# Patient Record
Sex: Female | Born: 1980 | Race: White | Hispanic: No | Marital: Married | Smoking: Never smoker
Health system: Southern US, Community
[De-identification: ages and names within clinical notes are randomized; demographics above are authoritative.]

## PROBLEM LIST (undated history)

## (undated) DIAGNOSIS — Z8619 Personal history of other infectious and parasitic diseases: Secondary | ICD-10-CM

## (undated) DIAGNOSIS — F419 Anxiety disorder, unspecified: Secondary | ICD-10-CM

## (undated) HISTORY — PX: CERVICAL BIOPSY  W/ LOOP ELECTRODE EXCISION: SUR135

## (undated) HISTORY — DX: Anxiety disorder, unspecified: F41.9

## (undated) HISTORY — PX: COLPOSCOPY: SHX161

## (undated) HISTORY — DX: Personal history of other infectious and parasitic diseases: Z86.19

---
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
## 2002-04-06 DIAGNOSIS — K219 Gastro-esophageal reflux disease without esophagitis: Secondary | ICD-10-CM | POA: Insufficient documentation

## 2004-02-16 ENCOUNTER — Emergency Department: Payer: Self-pay | Admitting: Emergency Medicine

## 2004-02-22 ENCOUNTER — Ambulatory Visit: Payer: Self-pay

## 2005-04-06 HISTORY — PX: TUBAL LIGATION: SHX77

## 2006-03-10 ENCOUNTER — Observation Stay: Payer: Self-pay

## 2006-04-01 ENCOUNTER — Inpatient Hospital Stay: Payer: Self-pay | Admitting: Obstetrics and Gynecology

## 2006-04-06 DIAGNOSIS — Z90711 Acquired absence of uterus with remaining cervical stump: Secondary | ICD-10-CM | POA: Insufficient documentation

## 2006-04-06 HISTORY — PX: ABDOMINAL HYSTERECTOMY: SHX81

## 2007-01-19 ENCOUNTER — Ambulatory Visit: Payer: Self-pay

## 2007-01-25 ENCOUNTER — Ambulatory Visit: Payer: Self-pay

## 2007-02-16 ENCOUNTER — Ambulatory Visit: Payer: Self-pay

## 2007-02-22 ENCOUNTER — Ambulatory Visit: Payer: Self-pay

## 2008-07-18 DIAGNOSIS — Z8249 Family history of ischemic heart disease and other diseases of the circulatory system: Secondary | ICD-10-CM | POA: Insufficient documentation

## 2011-12-01 LAB — CBC AND DIFFERENTIAL
HCT: 43 % (ref 36–46)
HEMOGLOBIN: 14.7 g/dL (ref 12.0–16.0)
Platelets: 272 10*3/uL (ref 150–399)
WBC: 4.9 10^3/mL

## 2011-12-01 LAB — BASIC METABOLIC PANEL
BUN: 8 mg/dL (ref 4–21)
Creatinine: 0.8 mg/dL (ref 0.5–1.1)
GLUCOSE: 83 mg/dL
POTASSIUM: 4.7 mmol/L (ref 3.4–5.3)
SODIUM: 140 mmol/L (ref 137–147)

## 2011-12-01 LAB — HEPATIC FUNCTION PANEL
ALT: 9 U/L (ref 7–35)
AST: 20 U/L (ref 13–35)

## 2011-12-01 LAB — TSH: TSH: 2.53 u[IU]/mL (ref 0.41–5.90)

## 2011-12-04 ENCOUNTER — Ambulatory Visit: Payer: Self-pay | Admitting: Family Medicine

## 2014-04-10 ENCOUNTER — Ambulatory Visit: Payer: Self-pay | Admitting: General Practice

## 2014-11-15 ENCOUNTER — Other Ambulatory Visit: Payer: Self-pay | Admitting: Unknown Physician Specialty

## 2014-11-15 DIAGNOSIS — Z1231 Encounter for screening mammogram for malignant neoplasm of breast: Secondary | ICD-10-CM

## 2014-11-16 ENCOUNTER — Ambulatory Visit: Payer: Self-pay

## 2015-01-10 LAB — HM PAP SMEAR: HM Pap smear: NEGATIVE

## 2015-04-09 ENCOUNTER — Other Ambulatory Visit: Payer: Self-pay | Admitting: Unknown Physician Specialty

## 2015-04-09 ENCOUNTER — Ambulatory Visit
Admission: RE | Admit: 2015-04-09 | Discharge: 2015-04-09 | Disposition: A | Discharge status: Home / Self Care | Payer: 59 | Source: Ambulatory Visit | Attending: Unknown Physician Specialty | Admitting: Unknown Physician Specialty

## 2015-04-09 DIAGNOSIS — Z1231 Encounter for screening mammogram for malignant neoplasm of breast: Secondary | ICD-10-CM | POA: Diagnosis not present

## 2015-04-11 ENCOUNTER — Ambulatory Visit
Admission: RE | Admit: 2015-04-11 | Discharge: 2015-04-11 | Disposition: A | Discharge status: Home / Self Care | Payer: 59 | Source: Ambulatory Visit | Attending: Unknown Physician Specialty | Admitting: Unknown Physician Specialty

## 2015-04-11 ENCOUNTER — Other Ambulatory Visit: Payer: Self-pay | Admitting: Unknown Physician Specialty

## 2015-04-11 DIAGNOSIS — N63 Unspecified lump in breast: Secondary | ICD-10-CM | POA: Insufficient documentation

## 2015-04-11 DIAGNOSIS — R928 Other abnormal and inconclusive findings on diagnostic imaging of breast: Secondary | ICD-10-CM

## 2015-04-16 ENCOUNTER — Other Ambulatory Visit: Payer: Self-pay | Admitting: Unknown Physician Specialty

## 2015-04-16 DIAGNOSIS — N6320 Unspecified lump in the left breast, unspecified quadrant: Secondary | ICD-10-CM

## 2015-04-17 ENCOUNTER — Ambulatory Visit
Admission: RE | Admit: 2015-04-17 | Discharge: 2015-04-17 | Disposition: A | Discharge status: Home / Self Care | Payer: 59 | Source: Ambulatory Visit | Attending: Unknown Physician Specialty | Admitting: Unknown Physician Specialty

## 2015-04-17 DIAGNOSIS — N6320 Unspecified lump in the left breast, unspecified quadrant: Secondary | ICD-10-CM

## 2015-04-17 DIAGNOSIS — N63 Unspecified lump in breast: Secondary | ICD-10-CM | POA: Insufficient documentation

## 2015-04-17 DIAGNOSIS — D242 Benign neoplasm of left breast: Secondary | ICD-10-CM | POA: Diagnosis not present

## 2015-04-17 HISTORY — PX: BREAST BIOPSY: SHX20

## 2015-04-18 LAB — SURGICAL PATHOLOGY

## 2015-04-22 DIAGNOSIS — H5213 Myopia, bilateral: Secondary | ICD-10-CM | POA: Diagnosis not present

## 2015-04-25 DIAGNOSIS — M79671 Pain in right foot: Secondary | ICD-10-CM | POA: Diagnosis not present

## 2015-04-25 DIAGNOSIS — M84374A Stress fracture, right foot, initial encounter for fracture: Secondary | ICD-10-CM | POA: Diagnosis not present

## 2015-05-02 DIAGNOSIS — F419 Anxiety disorder, unspecified: Secondary | ICD-10-CM | POA: Insufficient documentation

## 2015-05-02 DIAGNOSIS — F32A Depression, unspecified: Secondary | ICD-10-CM | POA: Insufficient documentation

## 2015-05-02 DIAGNOSIS — R51 Headache: Secondary | ICD-10-CM

## 2015-05-02 DIAGNOSIS — D649 Anemia, unspecified: Secondary | ICD-10-CM | POA: Insufficient documentation

## 2015-05-02 DIAGNOSIS — F329 Major depressive disorder, single episode, unspecified: Secondary | ICD-10-CM | POA: Insufficient documentation

## 2015-05-02 DIAGNOSIS — G4700 Insomnia, unspecified: Secondary | ICD-10-CM | POA: Insufficient documentation

## 2015-05-02 DIAGNOSIS — R270 Ataxia, unspecified: Secondary | ICD-10-CM | POA: Insufficient documentation

## 2015-05-02 DIAGNOSIS — R519 Headache, unspecified: Secondary | ICD-10-CM | POA: Insufficient documentation

## 2015-05-03 ENCOUNTER — Ambulatory Visit (INDEPENDENT_AMBULATORY_CARE_PROVIDER_SITE_OTHER): Payer: 59 | Admitting: Family Medicine

## 2015-05-03 ENCOUNTER — Encounter: Payer: Self-pay | Admitting: Family Medicine

## 2015-05-03 VITALS — BP 100/70 | HR 85 | Temp 97.9°F | Resp 16 | Ht 65.0 in | Wt 119.0 lb

## 2015-05-03 DIAGNOSIS — M8440XA Pathological fracture, unspecified site, initial encounter for fracture: Secondary | ICD-10-CM | POA: Diagnosis not present

## 2015-05-03 NOTE — Progress Notes (Signed)
       Patient: Heidi Guerra Female    DOB: 1981/01/12   35 y.o.   MRN: [id number] Visit Date: 05/03/2015  Today's Provider: Lelon Huh, MD   Chief Complaint  Patient presents with  . Foot Injury   Subjective:    HPI  Foot fracture:  Patient was seen at [facility] on 04/25/2015 due to foot pain. Right foot x rays were obtained and showed right foot metatarsal stress fracture; no displacement. Podiatry recommended immobilization and patient's foot was placed in a boot with ace wrap for the next month. Patient is to follow up in 4 weeks with Podiatry for repeat x rays and re-evaluation. Patient was advised to follow up with her   PCP to discuss 2 metatarsal stress fractures within the last year and mild thinning of the bone on x ray.  There was no injury that precipitated recent fracture. And no increase in activities. She is not an avid runner. She eats relatively balance diet and takes supplemental calcium and vitamin d every day. She has had partial hysterectomy, but states she still has both ovaries.      Allergies  Allergen Reactions  . Singulair  [Montelukast Sodium]     Rash  . Etodolac Rash   Previous Medications   ALPRAZOLAM (XANAX) 0.5 MG TABLET    Take 1-2 tablets by mouth 2 (two) times daily as needed. Reported on 05/03/2015   MULTIPLE VITAMINS-MINERALS (MULTIVITAMIN ADULT PO)    Take 1 tablet by mouth daily.   VENLAFAXINE HCL 75 MG TB24    Take 1 capsule by mouth daily. Reported on 05/03/2015    Review of Systems  Constitutional: Negative for fever, chills, appetite change and fatigue.  Respiratory: Negative for chest tightness and shortness of breath.   Cardiovascular: Negative for chest pain and palpitations.  Gastrointestinal: Negative for nausea, vomiting and abdominal pain.  Musculoskeletal: Positive for arthralgias (right foot;  right hip and shoulder since wearing boot).  Neurological: Negative for dizziness and weakness.    Social History    Substance Use Topics  . Smoking status: Never Smoker   . Smokeless tobacco: Not on file  . Alcohol Use: 0.0 oz/week    0 Standard drinks or equivalent per week     Comment: occasional use   Objective:   BP 100/70 mmHg  Pulse 85  Temp(Src) 97.9 F (36.6 C) (Oral)  Resp 16  Ht 5\' 5"  (1.651 m)  Wt 119 lb (53.978 kg)  BMI 19.80 kg/m2  SpO2 98%  Physical Exam  General appearance: alert, well developed, well nourished, cooperative and in no distress Head: Normocephalic, without obvious abnormality, atraumatic Lungs: Respirations even and unlabored Extremities: No gross deformities Skin: Skin color, texture, turgor normal. No rashes seen  Psych: Appropriate mood and affect. Neurologic: Mental status: Alert, oriented to person, place, and time, thought content appropriate.     Assessment & Plan:     1. Pathological fracture, initial encounter  - DG Bone Density; Future  Additional metabolic evaluation if BMD is low.       Lelon Huh, MD  [facility]

## 2015-05-06 ENCOUNTER — Telehealth: Payer: Self-pay

## 2015-05-06 ENCOUNTER — Encounter: Payer: Self-pay | Admitting: Family Medicine

## 2015-05-06 ENCOUNTER — Ambulatory Visit
Admission: RE | Admit: 2015-05-06 | Discharge: 2015-05-06 | Disposition: A | Discharge status: Home / Self Care | Payer: 59 | Source: Ambulatory Visit | Attending: Family Medicine | Admitting: Family Medicine

## 2015-05-06 DIAGNOSIS — M8438XA Stress fracture, other site, initial encounter for fracture: Secondary | ICD-10-CM | POA: Insufficient documentation

## 2015-05-06 DIAGNOSIS — X58XXXA Exposure to other specified factors, initial encounter: Secondary | ICD-10-CM | POA: Insufficient documentation

## 2015-05-06 DIAGNOSIS — Z13820 Encounter for screening for osteoporosis: Secondary | ICD-10-CM | POA: Diagnosis not present

## 2015-05-06 DIAGNOSIS — M84374A Stress fracture, right foot, initial encounter for fracture: Secondary | ICD-10-CM | POA: Diagnosis not present

## 2015-05-06 DIAGNOSIS — M8440XA Pathological fracture, unspecified site, initial encounter for fracture: Secondary | ICD-10-CM

## 2015-05-06 NOTE — Telephone Encounter (Signed)
Left message to call back  

## 2015-05-06 NOTE — Telephone Encounter (Signed)
-----   Message from Birdie Sons, MD sent at 05/06/2015 10:07 AM EST ----- Bone density test is normal. No further evaluation is needed.

## 2015-05-06 NOTE — Telephone Encounter (Signed)
Advised patient as below.  

## 2015-05-23 DIAGNOSIS — M84374D Stress fracture, right foot, subsequent encounter for fracture with routine healing: Secondary | ICD-10-CM | POA: Diagnosis not present

## 2015-05-23 DIAGNOSIS — M79671 Pain in right foot: Secondary | ICD-10-CM | POA: Diagnosis not present

## 2015-05-28 ENCOUNTER — Ambulatory Visit: Payer: Self-pay | Admitting: Physician Assistant

## 2015-05-28 ENCOUNTER — Encounter: Payer: Self-pay | Admitting: Physician Assistant

## 2015-05-28 VITALS — BP 100/70 | Temp 97.8°F

## 2015-05-28 DIAGNOSIS — J018 Other acute sinusitis: Secondary | ICD-10-CM

## 2015-05-28 MED ORDER — FLUTICASONE PROPIONATE 50 MCG/ACT NA SUSP: 2.0000 | Freq: Every day | NASAL | Status: DC

## 2015-05-28 MED ORDER — AZITHROMYCIN 250 MG PO TABS: ORAL_TABLET | ORAL | Status: DC

## 2015-05-28 NOTE — Progress Notes (Signed)
S: C/o runny nose and congestion with sinus pressure for 2 weeks, no fever, chills, cp/sob, v/d; mucus is green and thick, cough is sporadic, c/o of facial and dental pain. Left ear pain  Using otc meds:   O: PE: vitals wnl nad,  perrl eomi, normocephalic, tms dull, nasal mucosa red and swollen, throat injected, neck supple no lymph, lungs c t a, cv rrr, neuro intact  A:  Acute sinusitis   P: flonase, zpack, drink fluids, continue regular meds , use otc meds of choice, return if not improving in 5 days, return earlier if worsening

## 2015-06-04 ENCOUNTER — Ambulatory Visit (INDEPENDENT_AMBULATORY_CARE_PROVIDER_SITE_OTHER): Payer: 59 | Admitting: Family Medicine

## 2015-06-04 ENCOUNTER — Encounter: Payer: Self-pay | Admitting: Family Medicine

## 2015-06-04 VITALS — BP 102/66 | HR 115 | Temp 98.7°F | Resp 18 | Wt 116.0 lb

## 2015-06-04 DIAGNOSIS — R05 Cough: Secondary | ICD-10-CM

## 2015-06-04 DIAGNOSIS — J018 Other acute sinusitis: Secondary | ICD-10-CM

## 2015-06-04 DIAGNOSIS — R059 Cough, unspecified: Secondary | ICD-10-CM

## 2015-06-04 MED ORDER — AZITHROMYCIN 250 MG PO TABS: ORAL_TABLET | ORAL | Status: DC

## 2015-06-04 MED ORDER — HYDROCODONE-HOMATROPINE 5-1.5 MG/5ML PO SYRP: 5.0000 mL | ORAL_SOLUTION | Freq: Three times a day (TID) | ORAL | Status: DC | PRN

## 2015-06-04 NOTE — Progress Notes (Signed)
       Patient: Heidi Guerra Female    DOB: Mar 12, 1981   35 y.o.   MRN: [id number] Visit Date: 06/04/2015  Today's Provider: Lelon Huh, MD   Chief Complaint  Patient presents with  . Cough   Subjective:    Cough This is a new problem. Episode onset: 3 weeks ago. The problem has been gradually worsening. The cough is non-productive. Associated symptoms include chest pain (when coughing), chills, ear congestion, headaches, nasal congestion, postnasal drip, rhinorrhea and a sore throat. Pertinent negatives include no ear pain, fever, heartburn, hemoptysis, myalgias, rash, shortness of breath, sweats, weight loss or wheezing. Exacerbated by: talking or movement. Treatments tried: Delsym, Sudafed, Z pack, Flonase. The treatment provided mild relief.  [facility] and was prescribed Z pak and Flonase. Patient reports the symptoms improved while she was taking the Z pack then returned after completing it. Patient has soreness in her chest and back due to excessive coughing.     Allergies  Allergen Reactions  . Singulair  [Montelukast Sodium]     Rash  . Etodolac Rash   Previous Medications   ALPRAZOLAM (XANAX) 0.5 MG TABLET    Take 1-2 tablets by mouth 2 (two) times daily as needed. Reported on 06/04/2015   FLUTICASONE (FLONASE) 50 MCG/ACT NASAL SPRAY    Place 2 sprays into both nostrils daily.   MULTIPLE VITAMINS-MINERALS (MULTIVITAMIN ADULT PO)    Take 1 tablet by mouth daily.   VENLAFAXINE HCL 75 MG TB24    Take 1 capsule by mouth daily. Reported on 05/03/2015    Review of Systems  Constitutional: Positive for chills and fatigue. Negative for fever, weight loss and appetite change.  HENT: Positive for congestion, postnasal drip, rhinorrhea and sore throat. Negative for ear pain.   Respiratory: Positive for cough. Negative for hemoptysis, chest tightness, shortness of breath and wheezing.   Cardiovascular: Positive for chest pain (when  coughing). Negative for palpitations.  Gastrointestinal: Negative for heartburn, nausea, vomiting and abdominal pain.  Musculoskeletal: Negative for myalgias.  Skin: Negative for rash.  Neurological: Positive for headaches. Negative for dizziness and weakness.    Social History  Substance Use Topics  . Smoking status: Never Smoker   . Smokeless tobacco: Not on file  . Alcohol Use: 0.0 oz/week    0 Standard drinks or equivalent per week     Comment: occasional use   Objective:   BP 102/66 mmHg  Pulse 115  Temp(Src) 98.7 F (37.1 C) (Oral)  Resp 18  Wt 116 lb (52.617 kg)  SpO2 100%  Physical Exam  General Appearance:    Alert, cooperative, no distress  HENT:   bilateral TM normal without fluid or infection, neck without nodes, sinuses nontender and nasal mucosa congested  Eyes:    PERRL, conjunctiva/corneas clear, EOM's intact       Lungs:     Rare expiratory wheeze, no rales or rhonchi, respirations unlabored  Heart:    Regular rate and rhythm  Neurologic:   Awake, alert, oriented x 3. No apparent focal neurological           defect.           Assessment & Plan:           Lelon Huh, MD  [facility]

## 2015-06-25 DIAGNOSIS — M79671 Pain in right foot: Secondary | ICD-10-CM | POA: Diagnosis not present

## 2015-06-25 DIAGNOSIS — M84374D Stress fracture, right foot, subsequent encounter for fracture with routine healing: Secondary | ICD-10-CM | POA: Diagnosis not present

## 2015-07-01 DIAGNOSIS — Z872 Personal history of diseases of the skin and subcutaneous tissue: Secondary | ICD-10-CM | POA: Diagnosis not present

## 2015-07-01 DIAGNOSIS — B078 Other viral warts: Secondary | ICD-10-CM | POA: Diagnosis not present

## 2015-07-01 DIAGNOSIS — D485 Neoplasm of uncertain behavior of skin: Secondary | ICD-10-CM | POA: Diagnosis not present

## 2015-07-01 DIAGNOSIS — Z1283 Encounter for screening for malignant neoplasm of skin: Secondary | ICD-10-CM | POA: Diagnosis not present

## 2015-07-01 DIAGNOSIS — Z789 Other specified health status: Secondary | ICD-10-CM | POA: Diagnosis not present

## 2015-07-01 DIAGNOSIS — R208 Other disturbances of skin sensation: Secondary | ICD-10-CM | POA: Diagnosis not present

## 2015-07-01 DIAGNOSIS — L298 Other pruritus: Secondary | ICD-10-CM | POA: Diagnosis not present

## 2015-07-01 DIAGNOSIS — L538 Other specified erythematous conditions: Secondary | ICD-10-CM | POA: Diagnosis not present

## 2015-10-11 DIAGNOSIS — M79672 Pain in left foot: Secondary | ICD-10-CM | POA: Diagnosis not present

## 2016-01-06 DIAGNOSIS — D225 Melanocytic nevi of trunk: Secondary | ICD-10-CM | POA: Diagnosis not present

## 2016-01-06 DIAGNOSIS — D485 Neoplasm of uncertain behavior of skin: Secondary | ICD-10-CM | POA: Diagnosis not present

## 2016-01-13 ENCOUNTER — Encounter: Payer: Self-pay | Admitting: Physician Assistant

## 2016-01-13 ENCOUNTER — Ambulatory Visit: Payer: Self-pay | Admitting: Physician Assistant

## 2016-01-13 VITALS — BP 100/60 | HR 76 | Temp 98.0°F

## 2016-01-13 DIAGNOSIS — L239 Allergic contact dermatitis, unspecified cause: Secondary | ICD-10-CM | POA: Diagnosis not present

## 2016-01-13 DIAGNOSIS — L7682 Other postprocedural complications of skin and subcutaneous tissue: Secondary | ICD-10-CM

## 2016-01-13 NOTE — Progress Notes (Signed)
S/ here for incision check , suspicous mole removed by Dr.Graham at [facility]  In [place] recently, she states the preliminary  pathology came back concerning and a week ago she had another procedure to remove more . Stitches were absorbable and steristrips were applied . Today the steri strips came off. She has noted pain , tenderness and redness. She is here for evaluation. She denies fever or drainage. She is not on antbx . She does not have a follow up appt with Derm.   O/ VSS Alert anxious appearing    5 cm healed Incision to upper left back ,Intact ; red ,slightly indurated, tender to  About 1.5 cm both sides of incision ,  A/ painful  Incision - infection  Possible reaction to stitches   P / We discussed treating her with an antibiotic and that she would  call the office for her results and to let them know about her incision problem . At the end of our discussion she abruptly left the clinic without any explanation despite my attempts to address any concerns for her.

## 2016-01-17 DIAGNOSIS — Z0000 Encounter for general adult medical examination without abnormal findings: Secondary | ICD-10-CM | POA: Diagnosis not present

## 2016-01-17 DIAGNOSIS — Z01419 Encounter for gynecological examination (general) (routine) without abnormal findings: Secondary | ICD-10-CM | POA: Diagnosis not present

## 2016-01-22 DIAGNOSIS — Z01419 Encounter for gynecological examination (general) (routine) without abnormal findings: Secondary | ICD-10-CM | POA: Diagnosis not present

## 2016-01-22 DIAGNOSIS — Z0000 Encounter for general adult medical examination without abnormal findings: Secondary | ICD-10-CM | POA: Diagnosis not present

## 2016-02-18 ENCOUNTER — Ambulatory Visit (INDEPENDENT_AMBULATORY_CARE_PROVIDER_SITE_OTHER): Payer: 59 | Admitting: Family Medicine

## 2016-02-18 ENCOUNTER — Encounter: Payer: Self-pay | Admitting: Family Medicine

## 2016-02-18 VITALS — BP 120/70 | HR 96 | Temp 98.2°F | Resp 16 | Ht 65.0 in | Wt 120.0 lb

## 2016-02-18 DIAGNOSIS — R350 Frequency of micturition: Secondary | ICD-10-CM | POA: Diagnosis not present

## 2016-02-18 DIAGNOSIS — F410 Panic disorder [episodic paroxysmal anxiety] without agoraphobia: Secondary | ICD-10-CM | POA: Diagnosis not present

## 2016-02-18 LAB — POCT URINALYSIS DIPSTICK
BILIRUBIN UA: NEGATIVE
Blood, UA: NEGATIVE
Glucose, UA: NEGATIVE
KETONES UA: NEGATIVE
LEUKOCYTES UA: NEGATIVE
NITRITE UA: NEGATIVE
PH UA: 7
Protein, UA: NEGATIVE
Spec Grav, UA: 1.01
Urobilinogen, UA: 0.2

## 2016-02-18 MED ORDER — ALPRAZOLAM 0.5 MG PO TABS: 0.5000 mg | ORAL_TABLET | Freq: Two times a day (BID) | ORAL | 2 refills | Status: DC | PRN

## 2016-02-18 MED ORDER — PAROXETINE HCL 10 MG PO TABS: ORAL_TABLET | ORAL | 1 refills | Status: DC

## 2016-02-18 NOTE — Progress Notes (Signed)
Patient: Heidi Guerra Female    DOB: 1980-08-01   35 y.o.   MRN: [id number] Visit Date: 02/18/2016  Today's Provider: Lelon Huh, MD   Chief Complaint  Patient presents with  . Urinary Frequency  . Anxiety   Subjective:    HPI Urinary Frequency: Patient complains of frequency She has had symptoms for 3 days. Patient also complains of none. Patient denies fever. Patient does not have a history of recurrent UTI.  Patient does not have a history of pyelonephritis. She states she has been drinking a lot more water since she has been so anxious lately.  Anxiety: Patient complains of panic attacks.  She has the following symptoms: palpitations, shortness of breath, sweating. Onset of symptoms was approximately 2 weeks ago, gradually worsening since that time. She denies current suicidal and homicidal ideation. Family history significant for heart disease. Risk factors: previous episode of depression and work  She has been working in TEFL teacher at [employer] and had change in her job a few months ago which is wean panic attacks started. She is now having them every day. She has taken several SSRIs and alprazolam in the past which worked well.       Allergies  Allergen Reactions  . Singulair  [Montelukast Sodium]     Rash  . Etodolac Rash     Current Outpatient Prescriptions:  Marland Kitchen  Multiple Vitamins-Minerals (MULTIVITAMIN ADULT PO), Take 1 tablet by mouth daily., Disp: , Rfl:   Review of Systems  Constitutional: Negative.   Genitourinary: Positive for frequency.  Psychiatric/Behavioral: The patient is nervous/anxious.     Social History  Substance Use Topics  . Smoking status: Never Smoker  . Smokeless tobacco: Never Used  . Alcohol use 0.0 oz/week     Comment: occasional use   Objective:   BP 120/70 (BP Location: Right Arm, Patient Position: Sitting, Cuff Size: Normal)   Pulse 96   Temp 98.2 F (36.8 C) (Oral)   Resp 16   Ht 5\' 5"  (1.651 m)   Wt 120 lb  (54.4 kg)   BMI 19.97 kg/m   Physical Exam  General appearance: alert, well developed, well nourished, cooperative and in no distress Head: Normocephalic, without obvious abnormality, atraumatic Respiratory: Respirations even and unlabored, normal respiratory rate Extremities: No gross deformities Skin: Skin color, texture, turgor normal. No rashes seen  Psych: Appropriate mood and affect. Neurologic: Mental status: Alert, oriented to person, place, and time, thought content appropriate.   Results for orders placed or performed in visit on 02/18/16  POCT urinalysis dipstick  Result Value Ref Range   Color, UA yellow    Clarity, UA clear    Glucose, UA negative    Bilirubin, UA negative    Ketones, UA negative    Spec Grav, UA 1.010    Blood, UA negative    pH, UA 7.0    Protein, UA negtive    Urobilinogen, UA 0.2    Nitrite, UA negative    Leukocytes, UA Negative Negative       Assessment & Plan:     1. Panic attacks  - PARoxetine (PAXIL) 10 MG tablet; One tablet daily for 7 days, then increase to two tablets daily  Dispense: 60 tablet; Refill: 1 - ALPRAZolam (XANAX) 0.5 MG tablet; Take 1-2 tablets (0.5-1 mg total) by mouth 2 (two) times daily as needed. For panic attacks  Dispense: 30 tablet; Refill: 2  2. Frequent urination  - POCT urinalysis  dipstick       Lelon Huh, MD  [facility]

## 2016-03-19 ENCOUNTER — Other Ambulatory Visit: Payer: Self-pay | Admitting: Obstetrics & Gynecology

## 2016-03-19 DIAGNOSIS — Z1231 Encounter for screening mammogram for malignant neoplasm of breast: Secondary | ICD-10-CM

## 2016-04-13 ENCOUNTER — Other Ambulatory Visit: Payer: Self-pay | Admitting: Obstetrics & Gynecology

## 2016-04-13 ENCOUNTER — Ambulatory Visit
Admission: RE | Admit: 2016-04-13 | Discharge: 2016-04-13 | Disposition: A | Discharge status: Home / Self Care | Payer: 59 | Source: Ambulatory Visit | Attending: Obstetrics & Gynecology | Admitting: Obstetrics & Gynecology

## 2016-04-13 DIAGNOSIS — Z1231 Encounter for screening mammogram for malignant neoplasm of breast: Secondary | ICD-10-CM | POA: Diagnosis not present

## 2016-04-13 DIAGNOSIS — N6320 Unspecified lump in the left breast, unspecified quadrant: Secondary | ICD-10-CM

## 2016-04-14 ENCOUNTER — Ambulatory Visit
Admission: RE | Admit: 2016-04-14 | Discharge: 2016-04-14 | Disposition: A | Discharge status: Home / Self Care | Payer: 59 | Source: Ambulatory Visit | Attending: Obstetrics & Gynecology | Admitting: Obstetrics & Gynecology

## 2016-04-14 DIAGNOSIS — N6320 Unspecified lump in the left breast, unspecified quadrant: Secondary | ICD-10-CM | POA: Insufficient documentation

## 2016-04-14 DIAGNOSIS — N6489 Other specified disorders of breast: Secondary | ICD-10-CM | POA: Diagnosis not present

## 2016-04-20 DIAGNOSIS — H5213 Myopia, bilateral: Secondary | ICD-10-CM | POA: Diagnosis not present

## 2016-04-22 ENCOUNTER — Ambulatory Visit: Payer: 59

## 2016-04-23 ENCOUNTER — Other Ambulatory Visit: Payer: Self-pay | Admitting: Family Medicine

## 2016-04-23 DIAGNOSIS — F410 Panic disorder [episodic paroxysmal anxiety] without agoraphobia: Secondary | ICD-10-CM

## 2016-05-25 ENCOUNTER — Other Ambulatory Visit: Payer: Self-pay | Admitting: Family Medicine

## 2016-05-25 DIAGNOSIS — F410 Panic disorder [episodic paroxysmal anxiety] without agoraphobia: Secondary | ICD-10-CM

## 2016-05-25 NOTE — Telephone Encounter (Signed)
Please call in alprazolam.  

## 2016-05-25 NOTE — Telephone Encounter (Signed)
Rx called in to pharmacy. 

## 2016-06-03 ENCOUNTER — Encounter: Payer: Self-pay | Admitting: Family Medicine

## 2016-06-03 ENCOUNTER — Ambulatory Visit (INDEPENDENT_AMBULATORY_CARE_PROVIDER_SITE_OTHER): Payer: 59 | Admitting: Family Medicine

## 2016-06-03 VITALS — BP 102/76 | HR 71 | Temp 98.1°F | Resp 16 | Wt 126.0 lb

## 2016-06-03 DIAGNOSIS — S46912A Strain of unspecified muscle, fascia and tendon at shoulder and upper arm level, left arm, initial encounter: Secondary | ICD-10-CM | POA: Diagnosis not present

## 2016-06-03 MED ORDER — PREDNISONE 20 MG PO TABS: 20.0000 mg | ORAL_TABLET | Freq: Two times a day (BID) | ORAL | 0 refills | Status: AC

## 2016-06-03 NOTE — Patient Instructions (Signed)
Call for orthopedic referral if not improving in 2-3 days.

## 2016-06-03 NOTE — Progress Notes (Signed)
Patient: Heidi Guerra Female    DOB: [birth year]   36 y.o.   MRN: [id number] Visit Date: 06/03/2016  Today's Provider: Lelon Huh, MD   Chief Complaint  Patient presents with  . Shoulder Pain    x 3 weeks   Subjective:    Shoulder Pain   The pain is present in the left shoulder. This is a new problem. Episode onset: 3 weeks ago. There has been no history of extremity trauma. The problem occurs constantly. The problem has been gradually worsening. The quality of the pain is described as sharp. Associated symptoms include numbness (in left hand yesterday morning). Pertinent negatives include no fever. The symptoms are aggravated by activity. She has tried cold for the symptoms. The treatment provided mild relief.  Patient states 3 weeks ago she was trying to pull a scrub top over her head and heard a popping sound in her left shoulder.      Allergies  Allergen Reactions  . Singulair  [Montelukast Sodium]     Rash  . Etodolac Rash     Current Outpatient Prescriptions:  .  ALPRAZolam (XANAX) 0.5 MG tablet, TAKE 1 TO 2 TABLETS BY MOUTH TWICE A DAY AS NEEDED FOR PANIC ATTACKS, Disp: 30 tablet, Rfl: 2 .  Multiple Vitamins-Minerals (MULTIVITAMIN ADULT PO), Take 1 tablet by mouth daily., Disp: , Rfl:  .  PARoxetine (PAXIL) 20 MG tablet, Take 1 tablet (20 mg total) by mouth daily., Disp: 30 tablet, Rfl: 3  Review of Systems  Constitutional: Negative for appetite change, chills, fatigue and fever.  Respiratory: Negative for chest tightness and shortness of breath.   Cardiovascular: Negative for chest pain and palpitations.  Gastrointestinal: Negative for abdominal pain, nausea and vomiting.  Musculoskeletal: Positive for arthralgias (left shoulder).  Neurological: Positive for numbness (in left hand yesterday morning). Negative for dizziness and weakness.    Social History  Substance Use Topics  . Smoking status: Never Smoker  . Smokeless tobacco: Never Used  . Alcohol  use 0.0 oz/week     Comment: occasional use   Objective:   BP 102/76 (BP Location: Left Arm, Patient Position: Sitting, Cuff Size: Normal)   Pulse 71   Temp 98.1 F (36.7 C) (Oral)   Resp 16   Wt 126 lb (57.2 kg)   SpO2 99% Comment: room air  BMI 20.97 kg/m   Physical Exam  General appearance: alert, well developed, well nourished, cooperative and in no distress Head: Normocephalic, without obvious abnormality, atraumatic Respiratory: Respirations even and unlabored, normal respiratory rate Extremities: Tender lateral left shoulder at origine of posterior deltoid. Active abduction limited to 30 degrees due to pain, passive abduction limited to 90 degrees. No swelling. No erythema.      Assessment & Plan:     1. Strain of left shoulder, initial encounter No improvement after 3 weeks of treatment with OTC NSAIDs and ice. Discusses options of steroid injection, oral antiinflammatories, PT and orthopedic referral. Will try short course of oral steroid, continue applying ice and counseled on passive ROM exercises.  Patient Instructions  Call for orthopedic referral if not improving in 2-3 days.    - predniSONE (DELTASONE) 20 MG tablet; Take 1 tablet (20 mg total) by mouth 2 (two) times daily with a meal.  Dispense: 20 tablet; Refill: 0  The entirety of the information documented in the History of Present Illness, Review of Systems and Physical Exam were personally obtained by me. Portions of this information  were initially documented by Meyer Cory, CMA and reviewed by me for thoroughness and accuracy.        Lelon Huh, MD  [facility]

## 2016-06-08 ENCOUNTER — Encounter: Payer: Self-pay | Admitting: Family Medicine

## 2016-06-17 ENCOUNTER — Encounter: Payer: Self-pay | Admitting: Family Medicine

## 2016-06-17 NOTE — Telephone Encounter (Signed)
Please advise patient that since her shoulder is not improving, she should get a referral to orthopedist, she will probably need a cortisone shot and maybe some physical therapy. Ok to enter referral if she is agreeable.

## 2016-06-18 ENCOUNTER — Telehealth: Payer: Self-pay | Admitting: Family Medicine

## 2016-06-18 DIAGNOSIS — M25512 Pain in left shoulder: Secondary | ICD-10-CM

## 2016-06-18 NOTE — Telephone Encounter (Signed)
Birdie Sons, MD at 06/17/2016 5:29 PM   Status: Signed    Please advise patient that since her shoulder is not improving, she should get a referral to orthopedist, she will probably need a cortisone shot and maybe some physical therapy. Ok to enter referral if she is agreeable.

## 2016-06-18 NOTE — Telephone Encounter (Signed)
Patient advised. She agrees to proceed with Ortho referral. Referral ordered. Advised patient she will receive a call back with appointment date/time.

## 2016-06-22 DIAGNOSIS — M7542 Impingement syndrome of left shoulder: Secondary | ICD-10-CM | POA: Diagnosis not present

## 2016-07-07 ENCOUNTER — Ambulatory Visit: Payer: 59 | Attending: Orthopedic Surgery

## 2016-07-07 DIAGNOSIS — M25612 Stiffness of left shoulder, not elsewhere classified: Secondary | ICD-10-CM | POA: Insufficient documentation

## 2016-07-07 DIAGNOSIS — M25512 Pain in left shoulder: Secondary | ICD-10-CM | POA: Insufficient documentation

## 2016-07-07 DIAGNOSIS — M6281 Muscle weakness (generalized): Secondary | ICD-10-CM | POA: Insufficient documentation

## 2016-07-07 NOTE — Therapy (Signed)
[place] MAIN [facility] SERVICES [address] [place], [place], [address] Phone: [phone]   Fax:  [phone]  Physical Therapy Evaluation  Patient Details  Name: Heidi Guerra MRN: [id number] Date of Birth: [birth year] Referring Provider: Dr. Mack Guise  Encounter Date: 07/07/2016      PT End of Session - 07/07/16 1054    Visit Number 1   Number of Visits 12   Date for PT Re-Evaluation 08/04/16   PT Start Time 1000   PT Stop Time 1055   PT Time Calculation (min) 55 min   Activity Tolerance Patient tolerated treatment well   Behavior During Therapy [facility] for tasks assessed/performed      Past Medical History:  Diagnosis Date  . History of chicken pox     Past Surgical History:  Procedure Laterality Date  . ABDOMINAL HYSTERECTOMY  2008  . 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 TONSILLECTOMY AND ADENOIDECTOMY  1999  . TUBAL LIGATION  2007    There were no vitals filed for this visit.       Subjective Assessment - 07/07/16 1013    Subjective Pt is a 36 yo female with L shoulder impingement and pain. Patient reports raising her arm overhead, washing her hair, driving with the left arm, and lifting items. Patient reports rest, ice and medication (NSAIDS) make the shoulder feel better. Patient report she has little to no pain when not using the shoulder to lift overhead. Patient reports the worst pain in the past week has a 5/10. Patient's baseline pain is dependent on use of L UE during the day.     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    Limitations Lifting   Diagnostic tests X-Ray: no fxs   Patient Stated Goals To be able to use her shoulder at full capacity.    Currently in Pain? Yes   Pain Score 2    Pain Location Shoulder   Pain Orientation Left   Pain Descriptors / Indicators Aching   Pain Type Acute pain             [facility] PT Assessment - 07/07/16 1008      Assessment   Medical Diagnosis L shoulder impingement   Referring Provider Dr. Mack Guise   Onset Date/Surgical Date 04/25/16   Hand Dominance Right   Next MD Visit unknown   Prior Therapy none     Balance Screen   Has the patient fallen in the past 6 months No   Has the patient had a decrease in activity level because of a fear of falling?  No   Is the patient reluctant to leave their home because of a fear of falling?  No     Home Environment   Living Environment Private residence   Living Arrangements Spouse/significant other   Available Help at Discharge Family   Type of [place] Access Level entry   [place] Two level   Alternate Level Stairs-Number of Steps 20     Prior Function   Level of Independence Independent   Vocation Full time employment   Press photographer, lifting, reaching   Leisure fishing, kayaking, shooting      Cognition   Overall Cognitive Status Within Functional Limits for tasks assessed     ROM / Strength   AROM / PROM / Strength Strength;AROM  AROM   AROM Assessment Site Shoulder;Thoracic;Ankle;Cervical   Right/Left Shoulder Right;Left   Right Shoulder Flexion 180 Degrees   Right Shoulder ABduction 180 Degrees   Right Shoulder Internal Rotation 80 Degrees   Right Shoulder External Rotation 80 Degrees   Left Shoulder Flexion 130 Degrees   Left Shoulder ABduction 135 Degrees   Left Shoulder Internal Rotation 80 Degrees   Left Shoulder External Rotation 70 Degrees   Cervical Flexion 60   Cervical Extension 40   Cervical - Right Side Bend 35   Cervical - Left Side Bend 35   Cervical - Right Rotation 85   Cervical - Left Rotation 85   Thoracic Flexion WNL   Thoracic Extension %50 of NL   Thoracic - Right Side Bend 25% of NL   Thoracic - Left Side Bend 25% of NL   Thoracic - Right Rotation 25% of NL   Thoracic - Left Rotation 25% of NL     Strength    Strength Assessment Site Shoulder   Right/Left Shoulder Right;Left   Right Shoulder Flexion 5/5   Right Shoulder Extension 5/5   Right Shoulder ABduction 5/5   Right Shoulder Internal Rotation 5/5   Right Shoulder External Rotation 5/5   Left Shoulder Flexion 3+/5   Left Shoulder ABduction 3+/5   Left Shoulder Internal Rotation 4-/5   Left Shoulder External Rotation 3+/5     Evaluation: Palpation: Increased TTP along infraspinatus, supraspinatus, and teres minor and along the L upper trap. Observation: slight FHP and forwardly rounded shoulders Apley Scratch test behind head: C6 on the L UE Apley Scratch test behind back: t10 on the L UE  TREATMENT: Seated ball roll outs in sitting -- x20 Isometric Shoulder ER -- x 10 5 sec hold Scapular retraction in standing -- x 10  Cervical retraction in supine into pillow -- x 10  Inferior glides in sitting holding table -- x 10         PT Education - 07/07/16 1053    Education provided Yes   Education Details HEP: scapular retraction, cervical retraction   Person(s) Educated Patient   Methods Explanation;Demonstration   Comprehension Verbalized understanding;Returned demonstration             PT Long Term Goals - 07/07/16 1202      PT LONG TERM GOAL #1   Title Patient will be independent with HEP focused on improving shoulder strength and mobility exercises to continue benefits of therapy after discharge.    Baseline dependent for form/technique   Time 6   Period Weeks   Status New     PT LONG TERM GOAL #2   Title Patient will be able to lift arm overhead to reach items in a high cabinet without increase in pain .   Baseline Able to perform 130 degrees of shoulder flexion    Time 6   Period Weeks   Status New     PT LONG TERM GOAL #3   Title Patient will be able to reach behind her head  with the L shoulder to the same distance as the R shoulder in order to better wash her hair   Baseline L shoulder reaches to C6    Time 6   Period Weeks   Status New     PT LONG TERM GOAL #4   Title Patient will be able to reach behind her back with the L shoulder to the same distance as the R shoulder in order to wash  her back.   Baseline L shoulder reaches to T   Time 6   Period Weeks   Status New               Plan - 07/07/16 1121    Clinical Impression Statement Patient is 36 yo right hand dominant female presenting with L shoulder pain and dysfunction. Patient demonstrates decreased ability to raise affected UE overhead without increased pain and decreased L shoulder ER motion. Indicating liklihood of impingement syndrome. Patient also demonstrates increased TTP rotator cuff musculature, decreased motor control, and decreased muscular endurance/strength throughout the L shoulder and patient will benefit from further skilled therapy to return to prior level of function.     Rehab Potential Good   Clinical Impairments Affecting Rehab Potential (+) age, family support (-) job which requires lifting   PT Frequency 2x / week   PT Duration 6 weeks   PT Treatment/Interventions ADLs/Self Care Home Management;Therapeutic exercise;Therapeutic activities;Neuromuscular re-education;Patient/family education;Passive range of motion;Dry needling;Iontophoresis 4mg /ml Dexamethasone;Cryotherapy;Electrical Stimulation;Moist Heat;Ultrasound;Manual techniques   PT Home Exercise Plan inf glide in sitting, iso shoulder ER, scapular retraction   Consulted and Agree with Plan of Care Patient      Patient will benefit from skilled therapeutic intervention in order to improve the following deficits and impairments:  Pain, Decreased coordination, Decreased mobility, Increased muscle spasms, Postural dysfunction, Decreased endurance, Decreased strength, Decreased range of motion, Decreased activity tolerance, Impaired UE functional use, Impaired flexibility, Increased fascial restricitons  Visit Diagnosis: Stiffness of left  shoulder, not elsewhere classified  Acute pain of left shoulder  Muscle weakness (generalized)     Problem List Patient Active Problem List   Diagnosis Date Noted  . Pathological fracture 05/03/2015  . Anemia 05/02/2015  . Anxiety 05/02/2015  . 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 Depression 05/02/2015  . Headache 05/02/2015  . Insomnia 05/02/2015  . Fam hx-ischem heart disease 07/18/2008  . 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 Esophageal reflux 04/06/2002  . Allergic rhinitis 04/06/1998    Blythe Stanford, PT DPT 07/07/2016, 1:08 PM  [place] MAIN [facility] SERVICES [address] [place], [place], [address] Phone: [phone]   Fax:  [phone]  Name: Heidi Guerra MRN: [id number] Date of Birth: 1980/09/22

## 2016-07-10 ENCOUNTER — Ambulatory Visit: Payer: 59

## 2016-07-10 DIAGNOSIS — M25612 Stiffness of left shoulder, not elsewhere classified: Secondary | ICD-10-CM

## 2016-07-10 DIAGNOSIS — M25512 Pain in left shoulder: Secondary | ICD-10-CM | POA: Diagnosis not present

## 2016-07-10 DIAGNOSIS — M6281 Muscle weakness (generalized): Secondary | ICD-10-CM

## 2016-07-10 NOTE — Therapy (Signed)
[place] MAIN [facility] SERVICES [address] [place], [place], [address] Phone: [phone]   Fax:  [phone]  Physical Therapy Treatment  Patient Details  Name: Heidi Guerra MRN: [id number] Date of Birth: [birth year] Referring Provider: Dr. Mack Guise  Encounter Date: 07/10/2016      PT End of Session - 07/10/16 0858    Visit Number 2   Number of Visits 12   Date for PT Re-Evaluation 08/04/16   PT Start Time 0800   PT Stop Time 0848   PT Time Calculation (min) 48 min   Activity Tolerance Patient tolerated treatment well   Behavior During Therapy [facility] for tasks assessed/performed      Past Medical History:  Diagnosis Date  . History of chicken pox     Past Surgical History:  Procedure Laterality Date  . ABDOMINAL HYSTERECTOMY  2008  . 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 TONSILLECTOMY AND ADENOIDECTOMY  1999  . TUBAL LIGATION  2007    There were no vitals filed for this visit.      Subjective Assessment - 07/10/16 0842    Subjective Patient reports she's been performing exercises at home and states she's able to move arm easier but still has to perform actions slowly.    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    Limitations Lifting   Diagnostic tests X-Ray: no fxs   Patient Stated Goals To be able to use her shoulder at full capacity.    Currently in Pain? No/denies      Observation: Apley Scratch test behind head: C6 on the L UE Apley Scratch test behind back: T4 on the L UE  Manual therapy: Sitting STM to the upper traps and peri-scapular muscular to decrease pain and spasms along the scapula/shoulder. Inf, A->P glides to the L shoulder to improve mobility grade III-IV 3 x 30 sec   TREATMENT: Resisted shoulder ER with YTB - 2 x 20  Seated high row at MATRIX - 2x 25 22.5# Standing Scapular  Retraction at MATRIX - 2 x 25 12.5# Seated thoracic extension against towel - 2 min Serratus Ant with RTB - 2 x 20  Shoulder Flexion wall angels -  x 20 Push up PLUS on the wall - x 20       PT Education - 07/10/16 0844    Education provided Yes   Education Details HEP: Resisted YTB SHOulder ER   Person(s) Educated Patient   Methods Explanation;Demonstration   Comprehension Verbalized understanding;Returned demonstration             PT Long Term Goals - 07/07/16 1202      PT LONG TERM GOAL #1   Title Patient will be independent with HEP focused on improving shoulder strength and mobility exercises to continue benefits of therapy after discharge.    Baseline dependent for form/technique   Time 6   Period Weeks   Status New     PT LONG TERM GOAL #2   Title Patient will be able to lift arm overhead to reach items in a high cabinet without increase in pain .   Baseline Able to perform 130 degrees of shoulder flexion    Time 6   Period Weeks   Status New     PT LONG TERM GOAL #3   Title Patient will be able to reach behind her head  with  the L shoulder to the same distance as the R shoulder in order to better wash her hair   Baseline L shoulder reaches to C6   Time 6   Period Weeks   Status New     PT LONG TERM GOAL #4   Title Patient will be able to reach behind her back with the L shoulder to the same distance as the R shoulder in order to wash her back.   Baseline L shoulder reaches to T   Time [place] - 07/10/16 0859    Clinical Impression Statement Patient demonstrates improvement in shoulder pain and function with improvement in appley behind the back scratch test and decreased resting pain compared to initial visit. Although patient is improving, she continues to demonstrate decreased motor control and muscular endurance to return to prior level of function.    Rehab Potential Good   Clinical Impairments  Affecting Rehab Potential (+) age, family support (-) job which requires lifting   PT Frequency 2x / week   PT Duration 6 weeks   PT Treatment/Interventions ADLs/Self Care Home Management;Therapeutic exercise;Therapeutic activities;Neuromuscular re-education;Patient/family education;Passive range of motion;Dry needling;Iontophoresis 4mg /ml Dexamethasone;Cryotherapy;Electrical Stimulation;Moist Heat;Ultrasound;Manual techniques   PT Home Exercise Plan inf glide in sitting, iso shoulder ER, scapular retraction   Consulted and Agree with Plan of Care Patient      Patient will benefit from skilled therapeutic intervention in order to improve the following deficits and impairments:  Pain, Decreased coordination, Decreased mobility, Increased muscle spasms, Postural dysfunction, Decreased endurance, Decreased strength, Decreased range of motion, Decreased activity tolerance, Impaired UE functional use, Impaired flexibility, Increased fascial restricitons  Visit Diagnosis: Stiffness of left shoulder, not elsewhere classified  Acute pain of left shoulder  Muscle weakness (generalized)     Problem List Patient Active Problem List   Diagnosis Date Noted  . Pathological fracture 05/03/2015  . Anemia 05/02/2015  . Anxiety 05/02/2015  . 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 Depression 05/02/2015  . Headache 05/02/2015  . Insomnia 05/02/2015  . Fam hx-ischem heart disease 07/18/2008  . 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 Esophageal reflux 04/06/2002  . Allergic rhinitis 04/06/1998    Blythe Stanford, PT DPT 07/10/2016, 9:02 AM  [place] MAIN [facility] SERVICES [address] [place], [place], [address] Phone: [phone]   Fax:  [phone]  Name: KATLYNE NISHIDA MRN: [id number] Date of Birth: [birth year]

## 2016-07-14 ENCOUNTER — Ambulatory Visit: Payer: 59

## 2016-07-14 DIAGNOSIS — M6281 Muscle weakness (generalized): Secondary | ICD-10-CM | POA: Diagnosis not present

## 2016-07-14 DIAGNOSIS — M25612 Stiffness of left shoulder, not elsewhere classified: Secondary | ICD-10-CM

## 2016-07-14 DIAGNOSIS — M25512 Pain in left shoulder: Secondary | ICD-10-CM | POA: Diagnosis not present

## 2016-07-14 NOTE — Therapy (Signed)
[place] MAIN [facility] SERVICES [address] [place], [place], [address] Phone: [phone]   Fax:  [phone]  Physical Therapy Treatment  Patient Details  Name: Heidi Guerra MRN: [id number] Date of Birth: 12/06/1980 Referring Provider: Dr. Mack Guise  Encounter Date: 07/14/2016      PT End of Session - 07/14/16 0840    Visit Number 3   Number of Visits 12   Date for PT Re-Evaluation 08/04/16   PT Start Time 0801   PT Stop Time 0845   PT Time Calculation (min) 44 min   Activity Tolerance Patient tolerated treatment well   Behavior During Therapy [facility] for tasks assessed/performed      Past Medical History:  Diagnosis Date  . History of chicken pox     Past Surgical History:  Procedure Laterality Date  . ABDOMINAL HYSTERECTOMY  2008  . 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 TONSILLECTOMY AND ADENOIDECTOMY  1999  . TUBAL LIGATION  2007    There were no vitals filed for this visit.      Subjective Assessment - 07/14/16 0825    Subjective Patient reports she has a 'crick' in her neck this morning from sleep position. Patient states shoulder pain is slightly aggrvated this am.    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    Limitations Lifting   Diagnostic tests X-Ray: no fxs   Patient Stated Goals To be able to use her shoulder at full capacity.    Currently in Pain? Yes   Pain Score 3    Pain Location Shoulder   Pain Orientation Left   Pain Descriptors / Indicators Aching   Pain Type Acute pain      Observation: Apley Scratch test behind head: C6 on the L UE Apley Scratch test behind back: T4 on the L UE   Manual therapy: Sitting STM to the upper traps and peri-scapular muscular to decrease pain and spasms along the scapula/shoulder. Inf, A->P glides to the L shoulder to improve mobility grade III-IV 3  x 30 sec   TREATMENT: Resisted shoulder ER with YTB - 2 x 25 Serratus punches without resistance - 2 x 25 UE ranger flexion - x 1.70min UE ranger ER/IR in standing - x1.75min UE ranger abduction in standing - x1.45min Standing Scapular Retraction at MATRIX - x 25 17.5# UBE - resistance level four with cueing on improving speed 1.5 min each direction       PT Education - 07/14/16 0827    Education provided Yes   Education Details Form/technique with exercise   Person(s) Educated Patient   Methods Explanation;Demonstration   Comprehension Verbalized understanding;Returned demonstration             PT Long Term Goals - 07/07/16 1202      PT LONG TERM GOAL #1   Title Patient will be independent with HEP focused on improving shoulder strength and mobility exercises to continue benefits of therapy after discharge.    Baseline dependent for form/technique   Time 6   Period Weeks   Status New     PT LONG TERM GOAL #2   Title Patient will be able to lift arm overhead to reach items in a high cabinet without increase in pain .   Baseline Able to perform 130 degrees of shoulder flexion    Time 6   Period Weeks  Status New     PT LONG TERM GOAL #3   Title Patient will be able to reach behind her head  with the L shoulder to the same distance as the R shoulder in order to better wash her hair   Baseline L shoulder reaches to C6   Time 6   Period Weeks   Status New     PT LONG TERM GOAL #4   Title Patient will be able to reach behind her back with the L shoulder to the same distance as the R shoulder in order to wash her back.   Baseline L shoulder reaches to T   Time 6   Period Weeks   Status New               Plan - 07/14/16 0841    Clinical Impression Statement Patient demonstrates decreased shoulder muscular endurance and focused on improving mobility and coordination to improve AROM. Patient demonstrates increased pain at ER shoulder flexion and abduction and  will benefit from further skilled therapy focused on improving muscular endurance and coordination to return to prior level of function.    Rehab Potential Good   Clinical Impairments Affecting Rehab Potential (+) age, family support (-) job which requires lifting   PT Frequency 2x / week   PT Duration 6 weeks   PT Treatment/Interventions ADLs/Self Care Home Management;Therapeutic exercise;Therapeutic activities;Neuromuscular re-education;Patient/family education;Passive range of motion;Dry needling;Iontophoresis 4mg /ml Dexamethasone;Cryotherapy;Electrical Stimulation;Moist Heat;Ultrasound;Manual techniques   PT Home Exercise Plan inf glide in sitting, iso shoulder ER, scapular retraction   Consulted and Agree with Plan of Care Patient      Patient will benefit from skilled therapeutic intervention in order to improve the following deficits and impairments:  Pain, Decreased coordination, Decreased mobility, Increased muscle spasms, Postural dysfunction, Decreased endurance, Decreased strength, Decreased range of motion, Decreased activity tolerance, Impaired UE functional use, Impaired flexibility, Increased fascial restricitons  Visit Diagnosis: Stiffness of left shoulder, not elsewhere classified  Acute pain of left shoulder  Muscle weakness (generalized)     Problem List Patient Active Problem List   Diagnosis Date Noted  . Pathological fracture 05/03/2015  . Anemia 05/02/2015  . Anxiety 05/02/2015  . 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 Depression 05/02/2015  . Headache 05/02/2015  . Insomnia 05/02/2015  . Fam hx-ischem heart disease 07/18/2008  . 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 Esophageal reflux 04/06/2002  . Allergic rhinitis 04/06/1998    Blythe Stanford, PT DPT 07/14/2016, 8:52 AM  [place] MAIN [facility] SERVICES [address] [place], [place], [address] Phone: [phone]   Fax:  [phone]  Name:  ANIQUA BRIERE MRN: [id number] Date of Birth: 1980/11/11

## 2016-07-16 ENCOUNTER — Ambulatory Visit: Payer: 59

## 2016-07-16 DIAGNOSIS — M25512 Pain in left shoulder: Secondary | ICD-10-CM | POA: Diagnosis not present

## 2016-07-16 DIAGNOSIS — M25612 Stiffness of left shoulder, not elsewhere classified: Secondary | ICD-10-CM

## 2016-07-16 DIAGNOSIS — M6281 Muscle weakness (generalized): Secondary | ICD-10-CM | POA: Diagnosis not present

## 2016-07-16 NOTE — Therapy (Signed)
[place] MAIN [facility] SERVICES [address] [place], [place], [address] Phone: [phone]   Fax:  [phone]  Physical Therapy Treatment  Patient Details  Name: Heidi Guerra MRN: [id number] Date of Birth: April 25, 1980 Referring Provider: Dr. Mack Guise  Encounter Date: 07/16/2016      PT End of Session - 07/16/16 0825    Visit Number 4   Number of Visits 12   Date for PT Re-Evaluation 08/04/16   PT Start Time 0802   PT Stop Time 0845   PT Time Calculation (min) 43 min   Activity Tolerance Patient tolerated treatment well   Behavior During Therapy [facility] for tasks assessed/performed      Past Medical History:  Diagnosis Date  . History of chicken pox     Past Surgical History:  Procedure Laterality Date  . ABDOMINAL HYSTERECTOMY  2008  . 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 TONSILLECTOMY AND ADENOIDECTOMY  1999  . TUBAL LIGATION  2007    There were no vitals filed for this visit.      Subjective Assessment - 07/16/16 0821    Subjective Patient reports her shoulder feels much improved this morning. Patient states she's able to move the shoulder more without pain.    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    Limitations Lifting   Diagnostic tests X-Ray: no fxs   Patient Stated Goals To be able to use her shoulder at full capacity.    Currently in Pain? No/denies      Observation: Apley Scratch test behind head: T4 on the L UE Apley Scratch test behind back: T4 on the L UE   Manual therapy: Sitting STM to the upper traps and peri-scapular muscular to decrease pain and spasms along the scapula/shoulder. Inf, A->P glides to the L shoulder to improve mobility grade III-IV 3 x 30 sec   TREATMENT: Serratus punches without resistance - x 10 YTW at wall - x 10  Scapular low row - x 25 - 15# Scaular High row  in sitting - x 25 30# Rotational high chops - x25 25#  Standing Scapular Retraction at MATRIX - x 25 22.5# Push up PLUS at wall - x20 UBE - resistance level four with cueing on improving speed 1.5 min each direction       PT Education - 07/16/16 0824    Education provided Yes   Education Details Form/technique with exercise   Person(s) Educated Patient   Methods Explanation;Demonstration   Comprehension Verbalized understanding;Returned demonstration             PT Long Term Goals - 07/07/16 1202      PT LONG TERM GOAL #1   Title Patient will be independent with HEP focused on improving shoulder strength and mobility exercises to continue benefits of therapy after discharge.    Baseline dependent for form/technique   Time 6   Period Weeks   Status New     PT LONG TERM GOAL #2   Title Patient will be able to lift arm overhead to reach items in a high cabinet without increase in pain .   Baseline Able to perform 130 degrees of shoulder flexion    Time 6   Period Weeks   Status New     PT LONG TERM GOAL #3   Title Patient will be able to reach behind her head  with the L shoulder to the same distance as the R shoulder in order to better wash her hair   Baseline L shoulder reaches to C6   Time 6   Period Weeks   Status New     PT LONG TERM GOAL #4   Title Patient will be able to reach behind her back with the L shoulder to the same distance as the R shoulder in order to wash her back.   Baseline L shoulder reaches to T   Time 6   Period Weeks   Status New               Plan - 07/16/16 5188    Clinical Impression Statement Patient demonstrates decreased shoulder pain today and continued to improve shoulder resistance with exercises and progressed exercise difficulty. Although patient improving, she continues to demonstrate decreased pain and spasms and will benefit from further skilled therapy to return to prior level of function.    Rehab Potential Good    Clinical Impairments Affecting Rehab Potential (+) age, family support (-) job which requires lifting   PT Frequency 2x / week   PT Duration 6 weeks   PT Treatment/Interventions ADLs/Self Care Home Management;Therapeutic exercise;Therapeutic activities;Neuromuscular re-education;Patient/family education;Passive range of motion;Dry needling;Iontophoresis 4mg /ml Dexamethasone;Cryotherapy;Electrical Stimulation;Moist Heat;Ultrasound;Manual techniques   PT Home Exercise Plan inf glide in sitting, iso shoulder ER, scapular retraction   Consulted and Agree with Plan of Care Patient      Patient will benefit from skilled therapeutic intervention in order to improve the following deficits and impairments:  Pain, Decreased coordination, Decreased mobility, Increased muscle spasms, Postural dysfunction, Decreased endurance, Decreased strength, Decreased range of motion, Decreased activity tolerance, Impaired UE functional use, Impaired flexibility, Increased fascial restricitons  Visit Diagnosis: Stiffness of left shoulder, not elsewhere classified  Acute pain of left shoulder     Problem List Patient Active Problem List   Diagnosis Date Noted  . Pathological fracture 05/03/2015  . Anemia 05/02/2015  . Anxiety 05/02/2015  . 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 Depression 05/02/2015  . Headache 05/02/2015  . Insomnia 05/02/2015  . Fam hx-ischem heart disease 07/18/2008  . 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 Esophageal reflux 04/06/2002  . Allergic rhinitis 04/06/1998    Blythe Stanford, PT DPT 07/16/2016, 8:50 AM  [place] MAIN [facility] SERVICES [address] [place], [place], [address] Phone: [phone]   Fax:  [phone]  Name: Heidi Guerra MRN: [id number] Date of Birth: December 25, 1980

## 2016-07-20 ENCOUNTER — Ambulatory Visit: Payer: 59

## 2016-07-20 DIAGNOSIS — M25512 Pain in left shoulder: Secondary | ICD-10-CM | POA: Diagnosis not present

## 2016-07-20 DIAGNOSIS — M25612 Stiffness of left shoulder, not elsewhere classified: Secondary | ICD-10-CM

## 2016-07-20 DIAGNOSIS — M6281 Muscle weakness (generalized): Secondary | ICD-10-CM

## 2016-07-20 NOTE — Therapy (Signed)
[place] MAIN [facility] SERVICES [address] [place], [place], [address] Phone: [phone]   Fax:  [phone]  Physical Therapy Treatment  Patient Details  Name: Heidi Guerra MRN: [id number] Date of Birth: 1980/06/30 Referring Provider: Dr. Mack Guise  Encounter Date: 07/20/2016      PT End of Session - 07/20/16 1252    Visit Number 5   Number of Visits 12   Date for PT Re-Evaluation 08/04/16   PT Start Time 6314   PT Stop Time 1100   PT Time Calculation (min) 45 min   Activity Tolerance Patient tolerated treatment well   Behavior During Therapy [facility] for tasks assessed/performed      Past Medical History:  Diagnosis Date  . History of chicken pox     Past Surgical History:  Procedure Laterality Date  . ABDOMINAL HYSTERECTOMY  2008  . 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 TONSILLECTOMY AND ADENOIDECTOMY  1999  . TUBAL LIGATION  2007    There were no vitals filed for this visit.      Subjective Assessment - 07/20/16 1057    Subjective Patient reports her shoulder is a little aggravated when raising her arm overhead. Patient states increased pain after havign a busy weekend.    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    Limitations Lifting   Diagnostic tests X-Ray: no fxs   Patient Stated Goals To be able to use her shoulder at full capacity.    Currently in Pain? No/denies      Manual therapy: Sitting STM to the upper traps and peri-scapular muscular to decrease pain and spasms along the scapula/shoulder.    TREATMENT: Standing Shoulder ER -2 x10 7.5# Standing Scapular Retraction at MATRIX - x 25 27.5# Scapular retraction at lat pull down machine - x 25 30# TRX - x30 with focus on improving scapular retraction Resisted lat pull down with cueing on muscular activation - 30# x 25 Posterior thoracic  extensions over the chair - x30 Straight-arm push downs at MATRIX - 20# x30 UBE - resistance level four with cueing on improving speed 2.5 min each direction level 5 resistance       PT Education - 07/20/16 1101    Education provided Yes   Education Details form/technique with exercise   Person(s) Educated Patient   Methods Explanation;Demonstration   Comprehension Verbalized understanding;Returned demonstration             PT Long Term Goals - 07/07/16 1202      PT LONG TERM GOAL #1   Title Patient will be independent with HEP focused on improving shoulder strength and mobility exercises to continue benefits of therapy after discharge.    Baseline dependent for form/technique   Time 6   Period Weeks   Status New     PT LONG TERM GOAL #2   Title Patient will be able to lift arm overhead to reach items in a high cabinet without increase in pain .   Baseline Able to perform 130 degrees of shoulder flexion    Time 6   Period Weeks   Status New     PT LONG TERM GOAL #3   Title Patient will be able to reach behind her head  with the L shoulder to the same distance as the R shoulder in order to better wash her hair   Baseline L  shoulder reaches to C6   Time 6   Period Weeks   Status New     PT LONG TERM GOAL #4   Title Patient will be able to reach behind her back with the L shoulder to the same distance as the R shoulder in order to wash her back.   Baseline L shoulder reaches to T   Time 6   Period Weeks   Status New               Plan - 07/20/16 1253    Clinical Impression Statement Patient demonstrates improvement of pain with movement after performing exercises today indicating improvement with motor control and coordination. Although patient is improving, she continues to demonstrate decreased muscular endurance and will benefit from furhter skilled therapy to return to prior level of function.    Rehab Potential Good   Clinical Impairments Affecting  Rehab Potential (+) age, family support (-) job which requires lifting   PT Frequency 2x / week   PT Duration 6 weeks   PT Treatment/Interventions ADLs/Self Care Home Management;Therapeutic exercise;Therapeutic activities;Neuromuscular re-education;Patient/family education;Passive range of motion;Dry needling;Iontophoresis 4mg /ml Dexamethasone;Cryotherapy;Electrical Stimulation;Moist Heat;Ultrasound;Manual techniques   PT Home Exercise Plan inf glide in sitting, iso shoulder ER, scapular retraction   Consulted and Agree with Plan of Care Patient      Patient will benefit from skilled therapeutic intervention in order to improve the following deficits and impairments:  Pain, Decreased coordination, Decreased mobility, Increased muscle spasms, Postural dysfunction, Decreased endurance, Decreased strength, Decreased range of motion, Decreased activity tolerance, Impaired UE functional use, Impaired flexibility, Increased fascial restricitons  Visit Diagnosis: Stiffness of left shoulder, not elsewhere classified  Acute pain of left shoulder  Muscle weakness (generalized)     Problem List Patient Active Problem List   Diagnosis Date Noted  . Pathological fracture 05/03/2015  . Anemia 05/02/2015  . Anxiety 05/02/2015  . 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 Depression 05/02/2015  . Headache 05/02/2015  . Insomnia 05/02/2015  . Fam hx-ischem heart disease 07/18/2008  . 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 Esophageal reflux 04/06/2002  . Allergic rhinitis 04/06/1998    Blythe Stanford, PT DPT 07/20/2016, 12:57 PM  [place] MAIN [facility] SERVICES [address] [place], [place], [address] Phone: [phone]   Fax:  [phone]  Name: Heidi Guerra MRN: [id number] Date of Birth: 10-10-1980

## 2016-07-23 ENCOUNTER — Ambulatory Visit: Payer: 59

## 2016-07-23 DIAGNOSIS — M25612 Stiffness of left shoulder, not elsewhere classified: Secondary | ICD-10-CM

## 2016-07-23 DIAGNOSIS — M25512 Pain in left shoulder: Secondary | ICD-10-CM

## 2016-07-23 DIAGNOSIS — M6281 Muscle weakness (generalized): Secondary | ICD-10-CM

## 2016-07-23 NOTE — Therapy (Signed)
[place] MAIN [facility] SERVICES [address] [place], [place], [address] Phone: [phone]   Fax:  [phone]  Physical Therapy Treatment  Patient Details  Name: Heidi Guerra MRN: [id number] Date of Birth: [birth year] Referring Provider: Dr. Mack Guise  Encounter Date: 07/23/2016      PT End of Session - 07/23/16 0843    Visit Number 6   Number of Visits 12   Date for PT Re-Evaluation 08/04/16   PT Start Time 0800   PT Stop Time 0845   PT Time Calculation (min) 45 min   Activity Tolerance Patient tolerated treatment well   Behavior During Therapy [facility] for tasks assessed/performed      Past Medical History:  Diagnosis Date  . History of chicken pox     Past Surgical History:  Procedure Laterality Date  . ABDOMINAL HYSTERECTOMY  2008  . 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 TONSILLECTOMY AND ADENOIDECTOMY  1999  . TUBAL LIGATION  2007    There were no vitals filed for this visit.      Subjective Assessment - 07/23/16 0823    Subjective Patient reports no pain in the shoulder currently but states her shoulder pain has woke  her up throughout the night when rolling on it.    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    Limitations Lifting   Diagnostic tests X-Ray: no fxs   Patient Stated Goals To be able to use her shoulder at full capacity.    Currently in Pain? No/denies        Manual therapy: Sitting STM to the upper traps and peri-scapular muscular to decrease pain and spasms along the scapula/shoulder. Inferior glides, posterior glides - 3 x 30sec with pt in supine grade IV to improve joint motion   TREATMENT: Straight-arm push downs at MATRIX - 20# 2x30 Push up PLUS - 2 x 20 Serratus punches in standing with GTB - 2 x 20  Wall angels with back against wall - 2 x 15 Overhead ball rollouts on elbows  with blue PB at wall - x20  Standing Shoulder B ER - x20 with RTB         PT Education - 07/23/16 0843    Education provided Yes   Education Details form/tehcnique with exercise   Person(s) Educated Patient   Methods Explanation;Demonstration   Comprehension Verbalized understanding;Returned demonstration             PT Long Term Goals - 07/07/16 1202      PT LONG TERM GOAL #1   Title Patient will be independent with HEP focused on improving shoulder strength and mobility exercises to continue benefits of therapy after discharge.    Baseline dependent for form/technique   Time 6   Period Weeks   Status New     PT LONG TERM GOAL #2   Title Patient will be able to lift arm overhead to reach items in a high cabinet without increase in pain .   Baseline Able to perform 130 degrees of shoulder flexion    Time 6   Period Weeks   Status New     PT LONG TERM GOAL #3   Title Patient will be able to reach behind her head  with the L shoulder to the same distance as the R shoulder in order to better wash her hair   Baseline L  shoulder reaches to C6   Time 6   Period Weeks   Status New     PT LONG TERM GOAL #4   Title Patient will be able to reach behind her back with the L shoulder to the same distance as the R shoulder in order to wash her back.   Baseline L shoulder reaches to T   Time 6   Period Weeks   Status New               Plan - 07/23/16 0845    Clinical Impression Statement Focused on improving serratus anterior activation and stabilization with exercises and patient demonstrates increased fatigue indicating decreased muscular endurance. Patient is making improvement towards goals and will benefit from further skilled therapy to return to prior level of function.    Rehab Potential Good   Clinical Impairments Affecting Rehab Potential (+) age, family support (-) job which requires lifting   PT Frequency 2x / week   PT Duration 6 weeks   PT  Treatment/Interventions ADLs/Self Care Home Management;Therapeutic exercise;Therapeutic activities;Neuromuscular re-education;Patient/family education;Passive range of motion;Dry needling;Iontophoresis 4mg /ml Dexamethasone;Cryotherapy;Electrical Stimulation;Moist Heat;Ultrasound;Manual techniques   PT Home Exercise Plan inf glide in sitting, iso shoulder ER, scapular retraction   Consulted and Agree with Plan of Care Patient      Patient will benefit from skilled therapeutic intervention in order to improve the following deficits and impairments:  Pain, Decreased coordination, Decreased mobility, Increased muscle spasms, Postural dysfunction, Decreased endurance, Decreased strength, Decreased range of motion, Decreased activity tolerance, Impaired UE functional use, Impaired flexibility, Increased fascial restricitons  Visit Diagnosis: Stiffness of left shoulder, not elsewhere classified  Acute pain of left shoulder  Muscle weakness (generalized)     Problem List Patient Active Problem List   Diagnosis Date Noted  . Pathological fracture 05/03/2015  . Anemia 05/02/2015  . Anxiety 05/02/2015  . 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 Depression 05/02/2015  . Headache 05/02/2015  . Insomnia 05/02/2015  . Fam hx-ischem heart disease 07/18/2008  . 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 Esophageal reflux 04/06/2002  . Allergic rhinitis 04/06/1998    Blythe Stanford, PT DPT 07/23/2016, 8:55 AM  [place] MAIN [facility] SERVICES [address] [place], [place], [address] Phone: [phone]   Fax:  [phone]  Name: TYMBER STALLINGS MRN: [id number] Date of Birth: March 31, 1981

## 2016-07-27 ENCOUNTER — Ambulatory Visit: Payer: 59 | Admitting: Physical Therapy

## 2016-07-27 DIAGNOSIS — M25612 Stiffness of left shoulder, not elsewhere classified: Secondary | ICD-10-CM | POA: Diagnosis not present

## 2016-07-27 DIAGNOSIS — M6281 Muscle weakness (generalized): Secondary | ICD-10-CM

## 2016-07-27 DIAGNOSIS — M25512 Pain in left shoulder: Secondary | ICD-10-CM | POA: Diagnosis not present

## 2016-07-27 NOTE — Therapy (Signed)
[place] MAIN [facility] SERVICES [address] [place], [place], [address] Phone: [phone]   Fax:  [phone]  Physical Therapy Treatment  Patient Details  Name: Heidi Guerra MRN: [id number] Date of Birth: 1980/09/01 Referring Provider: Dr. Mack Guise  Encounter Date: 07/27/2016      PT End of Session - 07/27/16 0803    Visit Number 7   Number of Visits 12   Date for PT Re-Evaluation 08/04/16   PT Start Time 0802   PT Stop Time 0832   PT Time Calculation (min) 30 min   Activity Tolerance Patient tolerated treatment well   Behavior During Therapy [facility] for tasks assessed/performed      Past Medical History:  Diagnosis Date  . History of chicken pox     Past Surgical History:  Procedure Laterality Date  . ABDOMINAL HYSTERECTOMY  2008  . 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 TONSILLECTOMY AND ADENOIDECTOMY  1999  . TUBAL LIGATION  2007    There were no vitals filed for this visit.      Subjective Assessment - 07/27/16 0805    Subjective Pt reports no pain at rest but is still having pain with overhead activities.  Pt reports she no longer experiences popping.     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    Limitations Lifting   Diagnostic tests X-Ray: no fxs   Patient Stated Goals To be able to use her shoulder at full capacity.    Currently in Pain? No/denies   Multiple Pain Sites No      Manual therapy:  Prone STM to the L upper trap and peri-scapular muscular to decrease pain.  Increased muscular tension noted.  TREATMENT:  L shoulder F with GTB in standing 2x15. Cues to perform in painfree range. Fatigue noted at end of each set.  Push up PLUS on mat table - 2 x 20  Serratus punches in sitting with GTB - 2 x 20  Wall angels with back against wall - 2 x 15. Cues to avoid compensatory shoulder  elevation.  Push up plus against theraball and walking ball up, down, left, and right x10 in each direction.  Standing Shoulder B ER - x20 with GTB  Holding yellow medicine ball overhead in LUE with wall taps 2x1 minute. Pt able to perform painfree but reports muscle burning.             PT Education - 07/27/16 0803    Education provided Yes   Education Details Posture, exercise technique   Person(s) Educated Patient   Methods Explanation;Demonstration;Verbal cues;Tactile cues   Comprehension Verbalized understanding;Returned demonstration;Need further instruction             PT Long Term Goals - 07/07/16 1202      PT LONG TERM GOAL #1   Title Patient will be independent with HEP focused on improving shoulder strength and mobility exercises to continue benefits of therapy after discharge.    Baseline dependent for form/technique   Time 6   Period Weeks   Status New     PT LONG TERM GOAL #2   Title Patient will be able to lift arm overhead to reach items in a high cabinet without increase in pain .   Baseline Able to perform 130 degrees of shoulder flexion    Time 6   Period Weeks   Status  New     PT LONG TERM GOAL #3   Title Patient will be able to reach behind her head  with the L shoulder to the same distance as the R shoulder in order to better wash her hair   Baseline L shoulder reaches to C6   Time 6   Period Weeks   Status New     PT LONG TERM GOAL #4   Title Patient will be able to reach behind her back with the L shoulder to the same distance as the R shoulder in order to wash her back.   Baseline L shoulder reaches to T   Time 6   Period Weeks   Status New               Plan - 07/27/16 6659    Clinical Impression Statement Pt reports overall improvement in L shoulder pain.  She is able to perform L shoulder flexion with GTB for resistance in almost full ROM, limited by pain in last ~20 deg.  She demonstrates fatigue toward end of each set of  strengthening exercise.  She will benefit from continued skilled PT interventions for improved strength and functional use of LUE.     Rehab Potential Good   Clinical Impairments Affecting Rehab Potential (+) age, family support (-) job which requires lifting   PT Frequency 2x / week   PT Duration 6 weeks   PT Treatment/Interventions ADLs/Self Care Home Management;Therapeutic exercise;Therapeutic activities;Neuromuscular re-education;Patient/family education;Passive range of motion;Dry needling;Iontophoresis 4mg /ml Dexamethasone;Cryotherapy;Electrical Stimulation;Moist Heat;Ultrasound;Manual techniques   PT Home Exercise Plan inf glide in sitting, iso shoulder ER, scapular retraction   Consulted and Agree with Plan of Care Patient      Patient will benefit from skilled therapeutic intervention in order to improve the following deficits and impairments:  Pain, Decreased coordination, Decreased mobility, Increased muscle spasms, Postural dysfunction, Decreased endurance, Decreased strength, Decreased range of motion, Decreased activity tolerance, Impaired UE functional use, Impaired flexibility, Increased fascial restricitons  Visit Diagnosis: Stiffness of left shoulder, not elsewhere classified  Acute pain of left shoulder  Muscle weakness (generalized)     Problem List Patient Active Problem List   Diagnosis Date Noted  . Pathological fracture 05/03/2015  . Anemia 05/02/2015  . Anxiety 05/02/2015  . 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 Depression 05/02/2015  . Headache 05/02/2015  . Insomnia 05/02/2015  . Fam hx-ischem heart disease 07/18/2008  . 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 Esophageal reflux 04/06/2002  . Allergic rhinitis 04/06/1998    Collie Siad PT, DPT 07/27/2016, 8:32 AM  [place] MAIN [facility] SERVICES [address] [place], [place], [address] Phone: [phone]   Fax:  [phone]  Name: Heidi Guerra  MRN: [id number] Date of Birth: [birth year]

## 2016-08-03 DIAGNOSIS — M25512 Pain in left shoulder: Secondary | ICD-10-CM | POA: Diagnosis not present

## 2016-08-05 ENCOUNTER — Other Ambulatory Visit: Payer: Self-pay | Admitting: Orthopedic Surgery

## 2016-08-05 DIAGNOSIS — M25512 Pain in left shoulder: Secondary | ICD-10-CM

## 2016-08-07 ENCOUNTER — Ambulatory Visit: Payer: 59 | Attending: Orthopedic Surgery

## 2016-08-07 DIAGNOSIS — M25612 Stiffness of left shoulder, not elsewhere classified: Secondary | ICD-10-CM | POA: Diagnosis not present

## 2016-08-07 DIAGNOSIS — M25512 Pain in left shoulder: Secondary | ICD-10-CM | POA: Diagnosis not present

## 2016-08-07 DIAGNOSIS — M6281 Muscle weakness (generalized): Secondary | ICD-10-CM

## 2016-08-07 NOTE — Therapy (Signed)
[place] MAIN [facility] SERVICES [address] [place], [place], [address] Phone: [phone]   Fax:  [phone]  Physical Therapy Treatment  Patient Details  Name: Heidi Guerra MRN: [id number] Date of Birth: [birth year] Referring Provider: Dr. Mack Guise  Encounter Date: 08/07/2016      PT End of Session - 08/07/16 0856    Visit Number 8   Number of Visits 12   Date for PT Re-Evaluation 09/04/16   PT Start Time 0800   PT Stop Time 0845   PT Time Calculation (min) 45 min   Activity Tolerance Patient tolerated treatment well   Behavior During Therapy [facility] for tasks assessed/performed      Past Medical History:  Diagnosis Date  . History of chicken pox     Past Surgical History:  Procedure Laterality Date  . ABDOMINAL HYSTERECTOMY  2008  . 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 TONSILLECTOMY AND ADENOIDECTOMY  1999  . TUBAL LIGATION  2007    There were no vitals filed for this visit.      Subjective Assessment - 08/07/16 0833    Subjective Patient reports she feels her shoulder has stopped improving over the past week and reports she continues to have pain with raising her arm overhead. Patient states her arm is no longer makes 'clicking' noises.    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    Limitations Lifting   Diagnostic tests X-Ray: no fxs   Patient Stated Goals To be able to use her shoulder at full capacity.    Currently in Pain? No/denies      TREATMENT: Manual Therapy:  STM to the Tricep tendon and muscle belly to decrease increased spasms and pain. Patient reports it is an exact reproduction of pain with patient positioned in sitting. Performed STM to upper trap and levator scapulae on the L shoulder to decrease increased spasms and pain.   Therapeutic Exercise: B ER with yellow theraband - x20  B  Shoulder extension in standing with scapular retraction - x 20 Tricep extension overhead; Behind the back with yellow band at [employer] with 7.5# -- x 20  Self-glide:  inferior glide to the L Shoulder with hand on bottom aspect of the chair - 3 x 30sec Seated High Row at [facility] with 32.5# -- x20   Patient demonstrates increased fatigue at end of session  Observation: L Shoulder flexion: 145deg until onset of pain Behind the head L shoulder ER: T2 Behind the head L shoulder IR: T7         PT Education - 08/07/16 0855    Education provided Yes   Education Details Form/technique, tricep extension   Person(s) Educated Patient   Methods Demonstration;Explanation   Comprehension Verbalized understanding;Returned demonstration             PT Long Term Goals - 08/07/16 0901      PT LONG TERM GOAL #1   Title Patient will be independent with HEP focused on improving shoulder strength and mobility exercises to continue benefits of therapy after discharge.    Baseline dependent for form/technique; 08/07/16: Able to perform with moderate cueing on form/technique   Time 6   Period Weeks   Status On-going     PT LONG TERM GOAL #2   Title Patient will be able to lift arm overhead to full AROM to reach items in a  high cabinet without increase in pain .   Baseline Able to perform 130 degrees of shoulder flexion; 08/07/16: Able to reach 145 deg until onset of pain   Time 6   Period Weeks   Status On-going     PT LONG TERM GOAL #3   Title Patient will be able to reach behind her head  with the L shoulder to the same distance as the R shoulder in order to better wash her hair   Baseline L shoulder reaches to C6; 08/07/16: T2   Time 6   Period Weeks   Status On-going     PT LONG TERM GOAL #4   Title Patient will be able to reach behind her back with the L shoulder to the same distance as the R shoulder in order to wash her back.   Baseline L shoulder reaches to T10; 08/07/16: Reaches to T7    Time 4   Period Weeks   Status On-going               Plan - 08/07/16 0906    Clinical Impression Statement Patient demonstrates increased pain and spasms along triceps tendon and muscle belly indicating L triceps dysfunction with high probability leading to shoulder pain. Patient demonstrates improvement of pain free AROM compared to initial eval most notably with reaching behind ther head and back. Although patient is improving, she continues to demonstrates increased shoulder pain and will benefit from further skilled therapy to return to prior level of function.    Rehab Potential Good   Clinical Impairments Affecting Rehab Potential (+) age, family support (-) job which requires lifting   PT Frequency 2x / week   PT Duration 6 weeks   PT Treatment/Interventions ADLs/Self Care Home Management;Therapeutic exercise;Therapeutic activities;Neuromuscular re-education;Patient/family education;Passive range of motion;Dry needling;Iontophoresis 4mg /ml Dexamethasone;Cryotherapy;Electrical Stimulation;Moist Heat;Ultrasound;Manual techniques   PT Home Exercise Plan inf glide in sitting, iso shoulder ER, scapular retraction   Consulted and Agree with Plan of Care Patient      Patient will benefit from skilled therapeutic intervention in order to improve the following deficits and impairments:  Pain, Decreased coordination, Decreased mobility, Increased muscle spasms, Postural dysfunction, Decreased endurance, Decreased strength, Decreased range of motion, Decreased activity tolerance, Impaired UE functional use, Impaired flexibility, Increased fascial restricitons  Visit Diagnosis: Stiffness of left shoulder, not elsewhere classified  Acute pain of left shoulder  Muscle weakness (generalized)     Problem List Patient Active Problem List   Diagnosis Date Noted  . Pathological fracture 05/03/2015  . Anemia 05/02/2015  . Anxiety 05/02/2015  . 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 Depression  05/02/2015  . Headache 05/02/2015  . Insomnia 05/02/2015  . Fam hx-ischem heart disease 07/18/2008  . 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 Esophageal reflux 04/06/2002  . Allergic rhinitis 04/06/1998    Blythe Stanford, PT DPT 08/07/2016, 9:09 AM  [place] MAIN [facility] SERVICES [address] [place], [place], [address] Phone: [phone]   Fax:  [phone]  Name: Heidi Guerra MRN: [id number] Date of Birth: 1980-11-15

## 2016-08-14 ENCOUNTER — Ambulatory Visit: Payer: 59

## 2016-08-14 DIAGNOSIS — M6281 Muscle weakness (generalized): Secondary | ICD-10-CM

## 2016-08-14 DIAGNOSIS — M25512 Pain in left shoulder: Secondary | ICD-10-CM | POA: Diagnosis not present

## 2016-08-14 DIAGNOSIS — M25612 Stiffness of left shoulder, not elsewhere classified: Secondary | ICD-10-CM

## 2016-08-14 NOTE — Therapy (Signed)
[place] MAIN [facility] SERVICES [address] [place], [place], [address] Phone: [phone]   Fax:  [phone]  Physical Therapy Treatment  Patient Details  Name: Heidi Guerra MRN: [id number] Date of Birth: 03-05-1981 Referring Provider: Dr. Mack Guise  Encounter Date: 08/14/2016      PT End of Session - 08/14/16 0851    Visit Number 9   Number of Visits 12   Date for PT Re-Evaluation 09/04/16   PT Start Time 0800   PT Stop Time 0845   PT Time Calculation (min) 45 min   Activity Tolerance Patient tolerated treatment well   Behavior During Therapy [facility] for tasks assessed/performed      Past Medical History:  Diagnosis Date  . History of chicken pox     Past Surgical History:  Procedure Laterality Date  . ABDOMINAL HYSTERECTOMY  2008  . 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 TONSILLECTOMY AND ADENOIDECTOMY  1999  . TUBAL LIGATION  2007    There were no vitals filed for this visit.      Subjective Assessment - 08/14/16 0828    Subjective Patient reports increased pain when lowering her arm from a raised position, states she was very sore in her tricep region after the previous session but the pain improved after 3 days.    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    Limitations Lifting   Diagnostic tests X-Ray: no fxs   Patient Stated Goals To be able to use her shoulder at full capacity.    Currently in Pain? No/denies      TREATMENT: Manual Therapy:  STM to the subscap and UT tendon and muscle belly to decrease increased spasms and pain. Patient reports it is an exact reproduction of pain with patient positioned in sitting. Performed STM ap and levator scapulae on the L shoulder to decrease increased spasms and pain.   Shoulder Mob's Grade IV: Inferior A->P 3 x 30sec; Posterior A->P 3 x 30sec, Thoracic  mobilizations in prone Grade IV: P -> A T1-T8 with bilateral hand positioning 3 x 30sec; 1st Rib Inferior/lateral mobilization 3 x 30sec   Therapeutic Exercise: Internal Rotation shoulder AROM at machine -- x 20 Overhead ball taps on wall for LT activation -- x 20 Back against wall with arms overhead with cueing for scapular retraction -- 3 x 10 Ball roll outs with arm at 90deg for serratus ant activation -- x 45 sec cw/ccw Push up PLUS -- 2 x 15  Patient demonstrates increased fatigue at end of session         PT Education - 08/14/16 0851    Education provided Yes   Education Details Maintaining scapular retraction with shoulder flexion   Person(s) Educated Patient   Methods Explanation;Demonstration   Comprehension Verbalized understanding;Returned demonstration             PT Long Term Goals - 08/07/16 0901      PT LONG TERM GOAL #1   Title Patient will be independent with HEP focused on improving shoulder strength and mobility exercises to continue benefits of therapy after discharge.    Baseline dependent for form/technique; 08/07/16: Able to perform with moderate cueing on form/technique   Time 6   Period Weeks   Status On-going     PT LONG TERM GOAL #2   Title Patient will be able to lift arm overhead  to full AROM to reach items in a high cabinet without increase in pain .   Baseline Able to perform 130 degrees of shoulder flexion; 08/07/16: Able to reach 145 deg until onset of pain   Time 6   Period Weeks   Status On-going     PT LONG TERM GOAL #3   Title Patient will be able to reach behind her head  with the L shoulder to the same distance as the R shoulder in order to better wash her hair   Baseline L shoulder reaches to C6; 08/07/16: T2   Time 6   Period Weeks   Status On-going     PT LONG TERM GOAL #4   Title Patient will be able to reach behind her back with the L shoulder to the same distance as the R shoulder in order to wash her back.   Baseline L  shoulder reaches to T10; 08/07/16: Reaches to T7   Time 4   Period Weeks   Status On-going               Plan - 08/14/16 1610    Clinical Impression Statement Patient demonstrates improved pain and spasms along the triceps, but demonstrates decreased thoracic extension, scapular retraction and subscap tenderness with performance of overhead activities indicating shoulder dysfunction. Patient demonstrates decreased shoulder weakness most notably when raising her arm overhead and will benefit from further skilled therapy to improve motor control and shoulder weakness to return to prior level of function.    Rehab Potential Good   Clinical Impairments Affecting Rehab Potential (+) age, family support (-) job which requires lifting   PT Frequency 2x / week   PT Duration 6 weeks   PT Treatment/Interventions ADLs/Self Care Home Management;Therapeutic exercise;Therapeutic activities;Neuromuscular re-education;Patient/family education;Passive range of motion;Dry needling;Iontophoresis 4mg /ml Dexamethasone;Cryotherapy;Electrical Stimulation;Moist Heat;Ultrasound;Manual techniques   PT Home Exercise Plan inf glide in sitting, iso shoulder ER, scapular retraction   Consulted and Agree with Plan of Care Patient      Patient will benefit from skilled therapeutic intervention in order to improve the following deficits and impairments:  Pain, Decreased coordination, Decreased mobility, Increased muscle spasms, Postural dysfunction, Decreased endurance, Decreased strength, Decreased range of motion, Decreased activity tolerance, Impaired UE functional use, Impaired flexibility, Increased fascial restricitons  Visit Diagnosis: Stiffness of left shoulder, not elsewhere classified  Acute pain of left shoulder  Muscle weakness (generalized)     Problem List Patient Active Problem List   Diagnosis Date Noted  . Pathological fracture 05/03/2015  . Anemia 05/02/2015  . Anxiety 05/02/2015  . Ataxia  05/02/2015  . Depression 05/02/2015  . Headache 05/02/2015  . Insomnia 05/02/2015  . Fam hx-ischem heart disease 07/18/2008  . 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 Esophageal reflux 04/06/2002  . Allergic rhinitis 04/06/1998    Blythe Stanford, PT DPT 08/14/2016, 8:55 AM  [place] MAIN [facility] SERVICES [address] [place], [place], [address] Phone: [phone]   Fax:  [phone]  Name: Heidi Guerra MRN: [id number] Date of Birth: April 12, 1980

## 2016-08-18 ENCOUNTER — Ambulatory Visit
Admission: RE | Admit: 2016-08-18 | Discharge: 2016-08-18 | Disposition: A | Discharge status: Home / Self Care | Payer: 59 | Source: Ambulatory Visit | Attending: Orthopedic Surgery | Admitting: Orthopedic Surgery

## 2016-08-18 DIAGNOSIS — M75112 Incomplete rotator cuff tear or rupture of left shoulder, not specified as traumatic: Secondary | ICD-10-CM | POA: Insufficient documentation

## 2016-08-18 DIAGNOSIS — M25512 Pain in left shoulder: Secondary | ICD-10-CM | POA: Diagnosis not present

## 2016-08-18 DIAGNOSIS — M75102 Unspecified rotator cuff tear or rupture of left shoulder, not specified as traumatic: Secondary | ICD-10-CM | POA: Diagnosis not present

## 2016-08-18 MED ORDER — LIDOCAINE HCL (PF) 1 % IJ SOLN
10.0000 mL | Freq: Once | INTRAMUSCULAR | Status: AC
  Administered 2016-08-18: 10 mL
  Filled 2016-08-18: qty 10

## 2016-08-18 MED ORDER — IOPAMIDOL (ISOVUE-200) INJECTION 41%
50.0000 mL | Freq: Once | INTRAVENOUS | Status: AC
  Administered 2016-08-18: 15 mL via INTRAVENOUS
  Filled 2016-08-18: qty 50

## 2016-08-18 MED ORDER — GADOBENATE DIMEGLUMINE 529 MG/ML IV SOLN
5.0000 mL | Freq: Once | INTRAVENOUS | Status: AC | PRN
  Administered 2016-08-18: 0.01 mL via INTRA_ARTICULAR

## 2016-08-21 ENCOUNTER — Ambulatory Visit: Payer: 59

## 2016-08-21 DIAGNOSIS — M7542 Impingement syndrome of left shoulder: Secondary | ICD-10-CM | POA: Diagnosis not present

## 2016-08-21 DIAGNOSIS — M25512 Pain in left shoulder: Secondary | ICD-10-CM

## 2016-08-21 DIAGNOSIS — M6281 Muscle weakness (generalized): Secondary | ICD-10-CM | POA: Diagnosis not present

## 2016-08-21 DIAGNOSIS — M25612 Stiffness of left shoulder, not elsewhere classified: Secondary | ICD-10-CM | POA: Diagnosis not present

## 2016-08-21 NOTE — Therapy (Signed)
[place] MAIN [facility] SERVICES [address] [place], [place], [address] Phone: [phone]   Fax:  [phone]  Physical Therapy Treatment  Patient Details  Name: TARHONDA HOLLENBERG MRN: [id number] Date of Birth: 1981/02/10 Referring Provider: Dr. Mack Guise  Encounter Date: 08/21/2016      PT End of Session - 08/21/16 1140    Visit Number 10   Number of Visits 12   Date for PT Re-Evaluation 09/04/16   PT Start Time 0350   PT Stop Time 1133   PT Time Calculation (min) 48 min   Activity Tolerance Patient tolerated treatment well   Behavior During Therapy [facility] for tasks assessed/performed      Past Medical History:  Diagnosis Date  . History of chicken pox     Past Surgical History:  Procedure Laterality Date  . ABDOMINAL HYSTERECTOMY  2008  . 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 TONSILLECTOMY AND ADENOIDECTOMY  1999  . TUBAL LIGATION  2007    There were no vitals filed for this visit.      Subjective Assessment - 08/21/16 1138    Subjective Patient reports she received an MRI on tuesday and was told she was having mild tendonitis with inflammation with the tissues. Patient states the shoulder may be feeling slightly better.    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    Limitations Lifting   Diagnostic tests X-Ray: no fxs   Patient Stated Goals To be able to use her shoulder at full capacity.    Currently in Pain? No/denies     TREATMENT: Manual Therapy:  STM to the subscap, Internal rotatoin and UT tendon and muscle belly to decrease increased spasms and pain. Performed STM ap and levator scapulae on the L shoulder to decrease increased spasms and pain.    Shoulder Mob's Grade IV:; 1st Rib Inferior/lateral mobilization 3 x 30sec    Therapeutic Exercise: Shoulder ER isometrics at end range - 2x20  Thoracic  extension seated in chair - x 20  Serratus punches with GTB - 2 x 20 Internal Rotation shoulder AROM at machine -- x 20 7.5# Overhead ball taps on wall for LT activation -- x 20 tapping 3 locations Body blade IR/ER, overhead for LT activation, and at waist line for LT activation - x30sec Straight arm push downs at door with GTB - x 20     Patient demonstrates decreased pain when raising arm overhead with therapist performing scapular stabilization          PT Education - 08/21/16 1139    Education provided Yes   Education Details Resisted shoulder IR/ER with RTB; straight arm push downs with GTB   Person(s) Educated Patient   Methods Explanation;Demonstration   Comprehension Verbalized understanding;Returned demonstration             PT Long Term Goals - 08/07/16 0901      PT LONG TERM GOAL #1   Title Patient will be independent with HEP focused on improving shoulder strength and mobility exercises to continue benefits of therapy after discharge.    Baseline dependent for form/technique; 08/07/16: Able to perform with moderate cueing on form/technique   Time 6   Period Weeks   Status On-going     PT LONG TERM GOAL #2   Title Patient will be able to lift arm overhead to full AROM to reach items in  a high cabinet without increase in pain .   Baseline Able to perform 130 degrees of shoulder flexion; 08/07/16: Able to reach 145 deg until onset of pain   Time 6   Period Weeks   Status On-going     PT LONG TERM GOAL #3   Title Patient will be able to reach behind her head  with the L shoulder to the same distance as the R shoulder in order to better wash her hair   Baseline L shoulder reaches to C6; 08/07/16: T2   Time 6   Period Weeks   Status On-going     PT LONG TERM GOAL #4   Title Patient will be able to reach behind her back with the L shoulder to the same distance as the R shoulder in order to wash her back.   Baseline L shoulder reaches to T10; 08/07/16: Reaches to T7    Time 4   Period Weeks   Status On-going               Plan - 08/21/16 1140    Clinical Impression Statement Patient demonstrates decreased pain when raising arm overhead with therapist performing scapular stabilization indicating decreased shoulder muscular coordination and control. Patient demonstrates increased weakness with shoulder flexoin over 150 deg as indicated by increased fasiculations and patient will benefit from further skilled therapy to return to prior level of function.    Rehab Potential Good   Clinical Impairments Affecting Rehab Potential (+) age, family support (-) job which requires lifting   PT Frequency 2x / week   PT Duration 6 weeks   PT Treatment/Interventions ADLs/Self Care Home Management;Therapeutic exercise;Therapeutic activities;Neuromuscular re-education;Patient/family education;Passive range of motion;Dry needling;Iontophoresis 4mg /ml Dexamethasone;Cryotherapy;Electrical Stimulation;Moist Heat;Ultrasound;Manual techniques   PT Home Exercise Plan inf glide in sitting, iso shoulder ER, scapular retraction   Consulted and Agree with Plan of Care Patient      Patient will benefit from skilled therapeutic intervention in order to improve the following deficits and impairments:  Pain, Decreased coordination, Decreased mobility, Increased muscle spasms, Postural dysfunction, Decreased endurance, Decreased strength, Decreased range of motion, Decreased activity tolerance, Impaired UE functional use, Impaired flexibility, Increased fascial restricitons  Visit Diagnosis: Stiffness of left shoulder, not elsewhere classified  Acute pain of left shoulder  Muscle weakness (generalized)     Problem List Patient Active Problem List   Diagnosis Date Noted  . Pathological fracture 05/03/2015  . Anemia 05/02/2015  . Anxiety 05/02/2015  . 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 Depression 05/02/2015  . Headache 05/02/2015  . Insomnia 05/02/2015  . Fam hx-ischem heart  disease 07/18/2008  . 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 Esophageal reflux 04/06/2002  . Allergic rhinitis 04/06/1998    Blythe Stanford, PT DPT 08/21/2016, 11:42 AM  [place] MAIN [facility] SERVICES [address] [place], [place], [address] Phone: [phone]   Fax:  [phone]  Name: CHASADY LONGWELL MRN: [id number] Date of Birth: March 21, 1981

## 2016-09-01 ENCOUNTER — Other Ambulatory Visit: Payer: Self-pay | Admitting: Family Medicine

## 2016-09-01 ENCOUNTER — Ambulatory Visit: Payer: 59

## 2016-09-01 DIAGNOSIS — F410 Panic disorder [episodic paroxysmal anxiety] without agoraphobia: Secondary | ICD-10-CM

## 2016-09-01 NOTE — Telephone Encounter (Signed)
Rx called in to pharmacy. 

## 2016-09-01 NOTE — Telephone Encounter (Signed)
Please call in alprazolam.  

## 2016-09-28 ENCOUNTER — Other Ambulatory Visit: Payer: Self-pay | Admitting: Family Medicine

## 2016-09-28 DIAGNOSIS — F410 Panic disorder [episodic paroxysmal anxiety] without agoraphobia: Secondary | ICD-10-CM

## 2016-10-05 ENCOUNTER — Ambulatory Visit (INDEPENDENT_AMBULATORY_CARE_PROVIDER_SITE_OTHER): Payer: 59 | Admitting: Family Medicine

## 2016-10-05 ENCOUNTER — Encounter: Payer: Self-pay | Admitting: Family Medicine

## 2016-10-05 VITALS — BP 108/72 | HR 86 | Temp 98.0°F | Resp 16 | Wt 132.0 lb

## 2016-10-05 DIAGNOSIS — L258 Unspecified contact dermatitis due to other agents: Secondary | ICD-10-CM | POA: Diagnosis not present

## 2016-10-05 MED ORDER — PREDNISONE 20 MG PO TABS: 20.0000 mg | ORAL_TABLET | Freq: Two times a day (BID) | ORAL | 0 refills | Status: AC | PRN

## 2016-10-05 NOTE — Progress Notes (Signed)
       Patient: Heidi Guerra Female    DOB: [birth year]   36 y.o.   MRN: [id number] Visit Date: 10/05/2016  Today's Provider: Lelon Huh, MD   Chief Complaint  Patient presents with  . Rash   Subjective:    HPI  Pt is here today for a rash on her trunk. She reports that she worse a neoprene corset yesterday for a couple of hours. He goes around her waist. She took it off and showered and went to bed. This morning when she woke up she noticed her back was itching. She looked in the mirror and noticed her entire back where the corset was red as if she were sun burnt in that area. She also has some area on her sides and stomach that were affected. Does not look like any bumps in the area. She reports that it itches and burns. She took Zyrtec this morning and put hydrocortisone cream on the area.        Allergies  Allergen Reactions  . Singulair  [Montelukast Sodium]     Rash  . Etodolac Rash     Current Outpatient Prescriptions:  .  ALPRAZolam (XANAX) 0.5 MG tablet, TAKE 1 TO 2 TABLETS BY MOUTH TWICE A DAY AS NEEDED FOR PANIC ATTACKS, Disp: 30 tablet, Rfl: 5 .  Multiple Vitamins-Minerals (MULTIVITAMIN ADULT PO), Take 1 tablet by mouth daily., Disp: , Rfl:  .  PARoxetine (PAXIL) 20 MG tablet, TAKE 1 TABLET (20 MG TOTAL) BY MOUTH DAILY., Disp: 30 tablet, Rfl: 3  Review of Systems  Constitutional: Negative.   HENT: Negative.   Eyes: Negative.   Respiratory: Negative.   Cardiovascular: Negative.   Gastrointestinal: Negative.   Endocrine: Negative.   Genitourinary: Negative.   Musculoskeletal: Negative.   Skin: Positive for color change and rash.  Allergic/Immunologic: Negative.   Neurological: Negative.   Hematological: Negative.   Psychiatric/Behavioral: Negative.     Social History  Substance Use Topics  . Smoking status: Never Smoker  . Smokeless tobacco: Never Used  . Alcohol use 0.0 oz/week     Comment: occasional use   Objective:   BP 108/72 (BP Location:  Left Arm, Patient Position: Sitting, Cuff Size: Normal)   Pulse 86   Temp 98 F (36.7 C) (Oral)   Resp 16   Wt 132 lb (59.9 kg)   BMI 21.97 kg/m  Vitals:   10/05/16 1037  BP: 108/72  Pulse: 86  Resp: 16  Temp: 98 F (36.7 C)  TempSrc: Oral  Weight: 132 lb (59.9 kg)     Physical Exam  General appearance: alert, well developed, well nourished, cooperative and in no distress Head: Normocephalic, without obvious abnormality, atraumatic Respiratory: Respirations even and unlabored, normal respiratory rate Extremities: Sharply demarcated square area of back matching pattern of neoprene brace worn yesterday.     Assessment & Plan:     1. Contact dermatitis due to other agent, unspecified contact dermatitis type  - predniSONE (DELTASONE) 20 MG tablet; Take 1 tablet (20 mg total) by mouth 2 (two) times daily as needed.  Dispense: 12 tablet; Refill: 0  Call if symptoms change or if not rapidly improving.          Lelon Huh, MD  [facility]

## 2017-02-09 ENCOUNTER — Ambulatory Visit: Payer: Self-pay | Admitting: Obstetrics & Gynecology

## 2017-02-12 ENCOUNTER — Encounter: Payer: Self-pay | Admitting: Obstetrics & Gynecology

## 2017-02-12 ENCOUNTER — Ambulatory Visit (INDEPENDENT_AMBULATORY_CARE_PROVIDER_SITE_OTHER): Payer: 59 | Admitting: Obstetrics & Gynecology

## 2017-02-12 VITALS — BP 100/70 | HR 76 | Ht 65.0 in | Wt 128.0 lb

## 2017-02-12 DIAGNOSIS — Z8639 Personal history of other endocrine, nutritional and metabolic disease: Secondary | ICD-10-CM | POA: Diagnosis not present

## 2017-02-12 DIAGNOSIS — Z01419 Encounter for gynecological examination (general) (routine) without abnormal findings: Secondary | ICD-10-CM | POA: Diagnosis not present

## 2017-02-12 DIAGNOSIS — Z0000 Encounter for general adult medical examination without abnormal findings: Secondary | ICD-10-CM

## 2017-02-12 NOTE — Addendum Note (Signed)
Addended by: Gae Dry on: 02/12/2017 04:19 PM   Modules accepted: Orders

## 2017-02-12 NOTE — Progress Notes (Signed)
HPI:      [name] is a 36 y.o. [phone] who LMP was No LMP recorded. Patient has had a hysterectomy., she presents today for her annual examination. The patient has no complaints today. The patient is sexually active. Her last pap: approximate date 2016 and was normal and last mammogram: 04/2016 and was normal after a left breast biopsy was done. The patient does perform self breast exams.  There is no notable family history of breast or ovarian cancer in her family.  The patient has regular exercise: yes.  The patient denies current symptoms of depression.    GYN History: Contraception: status post hysterectomy  PMHx: Past Medical History:  Diagnosis Date  . History of chicken pox    Past Surgical History:  Procedure Laterality Date  . ABDOMINAL HYSTERECTOMY  2008  . 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 CERVICAL BIOPSY  W/ LOOP ELECTRODE EXCISION    . COLPOSCOPY    . TONSILLECTOMY AND ADENOIDECTOMY  1999  . TUBAL LIGATION  2007   Family History  Problem Relation Age of Onset  . CAD Father   . Heart attack Father   . Diabetes Maternal Grandmother   . Diabetes Maternal Grandfather    Social History   Tobacco Use  . Smoking status: Never Smoker  . Smokeless tobacco: Never Used  Substance Use Topics  . Alcohol use: Yes    Alcohol/week: 0.0 oz    Comment: occasional use  . Drug use: No    Current Outpatient Medications:  .  ALPRAZolam (XANAX) 0.5 MG tablet, TAKE 1 TO 2 TABLETS BY MOUTH TWICE A DAY AS NEEDED FOR PANIC ATTACKS, Disp: 30 tablet, Rfl: 5 .  Multiple Vitamins-Minerals (MULTIVITAMIN ADULT PO), Take 1 tablet by mouth daily., Disp: , Rfl:  .  PARoxetine (PAXIL) 20 MG tablet, TAKE 1 TABLET (20 MG TOTAL) BY MOUTH DAILY., Disp: 30 tablet, Rfl: 3 Allergies: Singulair  [montelukast sodium] and Etodolac  ROS  Objective: BP 100/70   Pulse 76   Ht 5\' 5"  (1.651 m)   Wt 128 lb (58.1 kg)   BMI 21.30 kg/m   Filed Weights   02/12/17 1531    Weight: 128 lb (58.1 kg)   Body mass index is 21.3 kg/m. Physical Exam  Constitutional: She is oriented to person, place, and time. She appears well-developed and well-nourished. No distress.  Genitourinary: Rectum normal and vagina normal. Pelvic exam was performed with patient supine. There is no rash or lesion on the right labia. There is no rash or lesion on the left labia. Vagina exhibits no lesion. No bleeding in the vagina. Right adnexum does not display mass and does not display tenderness. Left adnexum does not display mass and does not display tenderness.  Genitourinary Comments: Absent Uterus Absent cervix Vaginal cuff well healed  HENT:  Head: Normocephalic and atraumatic. Head is without laceration.  Right Ear: Hearing normal.  Left Ear: Hearing normal.  Nose: No epistaxis.  No foreign bodies.  Mouth/Throat: Uvula is midline, oropharynx is clear and moist and mucous membranes are normal.  Eyes: Pupils are equal, round, and reactive to light.  Neck: Normal range of motion. Neck supple. No thyromegaly present.  Cardiovascular: Normal rate and regular rhythm. Exam reveals no gallop and no friction rub.  No murmur heard. Pulmonary/Chest: Effort normal and breath sounds normal. No respiratory distress. She has no wheezes. Right breast exhibits no mass, no skin change and no tenderness. Left breast exhibits  no mass, no skin change and no tenderness.  Abdominal: Soft. Bowel sounds are normal. She exhibits no distension. There is no tenderness. There is no rebound.  Musculoskeletal: Normal range of motion.  Neurological: She is alert and oriented to person, place, and time. No cranial nerve deficit.  Skin: Skin is warm and dry.  Psychiatric: She has a normal mood and affect. Judgment normal.  Vitals reviewed.   Assessment:  ANNUAL EXAM 1. Annual physical exam     Screening Plan:            1.  Cervical Screening-  Pap smear schedule reviewed with patient  2. Breast  screening- Exam annually and mammogram>40 planned   3. Colonoscopy every 10 years, Hemoccult testing - after age 25  4. Labs last year labs reviewed; reported recent elevated cholesterol (255) and has been on intermittant Omega 3 use.  Rec cont Omega 3 and add Red Yeast Rice; repeat eval in 3-4 mos.  5. Counseling for contraception: s/p hyst   6. Kegels to prevent cystocele or incontinence advised    F/U  Return in about 1 year (around 02/12/2018) for Annual.  Barnett Applebaum, MD, [facility], [facility] 02/12/2017  4:14 PM

## 2017-02-12 NOTE — Patient Instructions (Signed)
Red Yeast Rice capsules What is this medicine? RED YEAST RICE (red yeest rahys) is intended to be used by healthy adults to help lower blood cholesterol in conjunction with a healthy diet and a regular exercise program. The FDA has not approved this supplement for any medical use. If medical treatment is needed for cholesterol control or any other disease, you should contact your doctor or health care professional regarding the use of this product. This supplement may be used for other purposes; ask your health care provider or pharmacist if you have questions. This medicine may be used for other purposes; ask your health care provider or pharmacist if you have questions. COMMON BRAND NAME(S): Red Yeast Rice What should I tell my health care provider before I take this medicine? They need to know if you have any of these conditions: -frequently drink alcoholic beverages -kidney disease -liver disease -muscle aches or weakness -other medical condition -an unusual or allergic reaction to red yeast rice, went yeast, lovastatin, other 'statin' medications, other medicines, foods, dyes, or preservatives -pregnant or trying to get pregnant -breast-feeding How should I use this medicine? Take this supplement by mouth with a glass of water. Follow the directions on the package labeling, or take as directed by your health care professional. Do not take this supplement more often than directed. Contact your pediatrician or health care professional regarding the use of this supplement in children. Special care may be needed. This supplement is not recommended for use in children. Overdosage: If you think you have taken too much of this medicine contact a poison control center or emergency room at once. NOTE: This medicine is only for you. Do not share this medicine with others. What if I miss a dose? If you miss a dose, take it as soon as you can. If it is almost time for your next dose, take only that  dose. Do not take double or extra doses. What may interact with this medicine? Do not take this medicine with any of the following medications: -clarithromycin -delavirdine -erythromycin -grapefruit juice -protease inhibitors used to treat HIV infection -medicines for fungal infections like itraconazole, ketoconazole, posaconazole, and voriconazole -mibefradil -nefazodone -other medicines for high cholesterol -telithromycin -troleandomycin This medicine may also interact with the following medications: -alcohol -amiodarone -colchicine -cyclosporine -danazol -diltiazem -fenofibrate -fluconazole -gemfibrozil -mifepristone, RU-486 -niacin -St. John's wort -verapamil -voriconazole -warfarin This list may not describe all possible interactions. Give your health care provider a list of all the medicines, herbs, non-prescription drugs, or dietary supplements you use. Also tell them if you smoke, drink alcohol, or use illegal drugs. Some items may interact with your medicine. What should I watch for while using this medicine? Visit your doctor or health care professional for regular check-ups. You may need regular tests to make sure your liver is working properly. Tell you doctor or health care professional right away if you get any unexplained muscle pain, tenderness, or weakness, especially if you also have a fever and tiredness. Some drugs may increase the risk of side effects from this supplement. If you are given certain antibiotics or antifungals, you should stop taking this supplement during those treatments. Check with your doctor or pharmacist for advice. If you are scheduled for any medical or dental procedure, tell your healthcare provider that you are taking this supplement. You may need to stop taking this supplement before the procedure. Do not use this drug if you are pregnant or breast-feeding. Serious side effects to an unborn child or   to an infant are possible. Talk to  your doctor or pharmacist for more information. Herbal or dietary supplements are not regulated like medicines. Rigid quality control standards are not required for dietary supplements. The purity and strength of these products can vary. The safety and effect of this dietary supplement for a certain disease or illness is not well known. This product is not intended to diagnose, treat, cure or prevent any disease. The Food and Drug Administration suggests the following to help consumers protect themselves: -Always read product labels and follow directions. -Natural does not mean a product is safe for humans to take. -Look for products that include USP after the ingredient name. This means that the manufacturer followed the standards of the [place] Pharmacopoeia. -Supplements made or sold by a nationally known food or drug company are more likely to be made under tight controls. You can write to the company for more information about how the product was made. What side effects may I notice from receiving this medicine? Side effects that you should report to your doctor or health care professional as soon as possible: -allergic reactions like skin rash, itching or hives, swelling of the face, lips, or tongue -dark urine -fever -joint pain -muscle cramps, pain -redness, blistering, peeling or loosening of the skin, including inside the mouth -trouble passing urine or change in the amount of urine -unusually weak or tired -yellowing of the eyes or skin Side effects that usually do not require medical attention (report to your doctor or health care professional if they continue or are bothersome): -constipation -headache -stomach gas, pain, upset -nausea -trouble sleeping This list may not describe all possible side effects. Call your doctor for medical advice about side effects. You may report side effects to FDA at 1-800-FDA-1088. Where should I keep my medicine? Keep out of the reach of  children. Store at room temperature between 15 and 30 degrees C (59 and 86 degrees F). Throw away any unused medicine after the expiration date. NOTE: This sheet is a summary. It may not cover all possible information. If you have questions about this medicine, talk to your doctor, pharmacist, or health care provider.  2018 Elsevier/Gold Standard (2013-11-28 10:26:14)  

## 2017-03-09 ENCOUNTER — Other Ambulatory Visit: Payer: Self-pay | Admitting: Family Medicine

## 2017-03-09 DIAGNOSIS — F410 Panic disorder [episodic paroxysmal anxiety] without agoraphobia: Secondary | ICD-10-CM

## 2017-03-10 ENCOUNTER — Encounter: Payer: Self-pay | Admitting: Physician Assistant

## 2017-03-10 ENCOUNTER — Ambulatory Visit: Payer: Self-pay | Admitting: Physician Assistant

## 2017-03-10 VITALS — BP 118/70 | HR 89 | Temp 97.9°F

## 2017-03-10 DIAGNOSIS — J0120 Acute ethmoidal sinusitis, unspecified: Secondary | ICD-10-CM

## 2017-03-10 MED ORDER — BENZONATATE 200 MG PO CAPS: 200.0000 mg | ORAL_CAPSULE | Freq: Two times a day (BID) | ORAL | 0 refills | Status: DC | PRN

## 2017-03-10 MED ORDER — FEXOFENADINE-PSEUDOEPHED ER 60-120 MG PO TB12: 1.0000 | ORAL_TABLET | Freq: Two times a day (BID) | ORAL | 0 refills | Status: DC

## 2017-03-10 MED ORDER — AMOXICILLIN 875 MG PO TABS
875.0000 mg | ORAL_TABLET | Freq: Two times a day (BID) | ORAL | 0 refills | Status: DC
Start: 2017-03-10 — End: 2017-03-24

## 2017-03-10 NOTE — Progress Notes (Signed)
   Subjective: Sinus congestion     Patient ID: Heidi Guerra, female    DOB: February 04, 1981, 36 y.o.   MRN: [id number]  HPI Patient complaining of sinus congestion intermittently rhinorrhea for 2 weeks. Patient also complaining of facial pain and frontal headache. Patient said her left ear feels full but denies hearing loss. Patient state mild relief using over-the-counter Sudafed and Zyrtec's. Patient also complaining of sore throat secondary to postnasal drainage. Patient denies nausea, vomiting, or diarrhea. Patient has flu shot this season.   Review of Systems     Objective:   Physical Exam HEENT is remarkable for left maxillary guarding and bilateral edematous nasal turbinates with thick rhinorrhea. Throat is erythematous with copious postnasal drainage. Neck is supple without tachypnea. Lungs clear to auscultation heart regular rate and rhythm.       Assessment & Plan: Sinusitis   Patient given discharge care instructions. Patient prescribed amoxicillin, Allegra-D, Tessalon Perles. Patient advised follow up PCP 1 week if no improvement.

## 2017-03-24 ENCOUNTER — Ambulatory Visit: Payer: Self-pay | Admitting: Physician Assistant

## 2017-03-24 ENCOUNTER — Encounter: Payer: Self-pay | Admitting: Physician Assistant

## 2017-03-24 VITALS — BP 110/60 | HR 100 | Temp 98.5°F | Resp 16

## 2017-03-24 DIAGNOSIS — H6982 Other specified disorders of Eustachian tube, left ear: Secondary | ICD-10-CM

## 2017-03-24 MED ORDER — PSEUDOEPHEDRINE HCL ER 120 MG PO TB12: 120.0000 mg | ORAL_TABLET | Freq: Two times a day (BID) | ORAL | 0 refills | Status: DC

## 2017-03-24 MED ORDER — METHYLPREDNISOLONE 4 MG PO TBPK: ORAL_TABLET | ORAL | 0 refills | Status: DC

## 2017-03-24 NOTE — Progress Notes (Signed)
   Subjective: Left ear pain     Patient ID: Heidi Guerra, female    DOB: 08/04/1980, 36 y.o.   MRN: [id number]  HPI Patient complaining of left ear pain/pressure since last visit for sinus infection. Patient finished a course of antibiotics, Allegra-D, and Tessalon Perles. Patient state no longer coughing. Patient stated mild hearing loss in the left ear.   Review of Systems  Anxiety    Objective:   Physical Exam HEENT reveals edematous but not erythematous left TM. Neck is supple without adenopathy. Lungs clear to auscultation heart regular rate and rhythm.       Assessment & Plan: Eustachian tube dysfunction   Patient given discharge Instructions. Patient get a prescription for Sudafed and Medrol Dosepak. Patient advised follow-up 5 days if no improvement.

## 2017-04-28 ENCOUNTER — Encounter: Payer: Self-pay | Admitting: Family Medicine

## 2017-04-28 ENCOUNTER — Ambulatory Visit (INDEPENDENT_AMBULATORY_CARE_PROVIDER_SITE_OTHER): Payer: No Typology Code available for payment source | Admitting: Family Medicine

## 2017-04-28 VITALS — BP 104/78 | HR 99 | Temp 98.3°F | Resp 16 | Wt 128.0 lb

## 2017-04-28 DIAGNOSIS — Z136 Encounter for screening for cardiovascular disorders: Secondary | ICD-10-CM | POA: Diagnosis not present

## 2017-04-28 DIAGNOSIS — H579 Unspecified disorder of eye and adnexa: Secondary | ICD-10-CM

## 2017-04-28 DIAGNOSIS — R232 Flushing: Secondary | ICD-10-CM | POA: Diagnosis not present

## 2017-04-28 NOTE — Progress Notes (Signed)
Patient: Heidi Guerra Female    DOB: 04/02/1981   37 y.o.   MRN: [id number] Visit Date: 04/28/2017  Today's Provider: Lelon Huh, MD   No chief complaint on file.  Subjective:    HPI  Reports that for the last 6 months she has been having clumps or hair fall out daily. No skin rashes or itches. Is concerned about thyroid. States that she has been very fatigued and episodes of sweats and hot flashes for several months. No difficulty sleeping. She also reports that she was found to have cholesterol of 250 when she was donating blood. Does not know HDL and LDL. She has started taking red yeast rice. She has had some stress and anxiety from work.    She also reports that she recently had eye exam and apparently had some findings concerning for possible glaucoma. She was advised to follow up with ophthalmologist and told to advise her PCP. However she states she doesn't think she requires a referral.   Past Surgical History:  Procedure Laterality Date  . ABDOMINAL HYSTERECTOMY  2008  . 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 CERVICAL BIOPSY  W/ LOOP ELECTRODE EXCISION    . COLPOSCOPY    . TONSILLECTOMY AND ADENOIDECTOMY  1999  . TUBAL LIGATION  2007      Allergies  Allergen Reactions  . Singulair  [Montelukast Sodium]     Rash  . Etodolac Rash     Current Outpatient Medications:  .  ALPRAZolam (XANAX) 0.5 MG tablet, TAKE 1 TO 2 TABLETS BY MOUTH TWICE A DAY AS NEEDED FOR PANIC ATTACKS, Disp: 30 tablet, Rfl: 5 .  Biotin 5 MG CAPS, Take by mouth., Disp: , Rfl:  .  CALCIUM PO, Take by mouth., Disp: , Rfl:  .  Multiple Vitamins-Minerals (MULTIVITAMIN ADULT PO), Take 1 tablet by mouth daily., Disp: , Rfl:  .  Omega-3 Fatty Acids (FISH OIL PO), Take by mouth., Disp: , Rfl:  .  PARoxetine (PAXIL) 20 MG tablet, TAKE 1 TABLET (20 MG TOTAL) BY MOUTH DAILY., Disp: 30 tablet, Rfl: 3 .  Red Yeast Rice Extract (RED YEAST RICE PO), Take by mouth., Disp: , Rfl:  .   methylPREDNISolone (MEDROL DOSEPAK) 4 MG TBPK tablet, Take Tapered dose as directed (Patient not taking: Reported on 04/28/2017), Disp: 21 tablet, Rfl: 0 .  pseudoephedrine (SUDAFED 12 HOUR) 120 MG 12 hr tablet, Take 1 tablet (120 mg total) by mouth 2 (two) times daily. (Patient not taking: Reported on 04/28/2017), Disp: 20 tablet, Rfl: 0  Review of Systems  Constitutional: Negative for appetite change, chills, fatigue and fever.  Respiratory: Negative for chest tightness and shortness of breath.   Cardiovascular: Negative for chest pain and palpitations.  Gastrointestinal: Negative for abdominal pain, nausea and vomiting.  Neurological: Negative for dizziness and weakness.    Social History   Tobacco Use  . Smoking status: Never Smoker  . Smokeless tobacco: Never Used  Substance Use Topics  . Alcohol use: Yes    Alcohol/week: 0.0 oz    Comment: occasional use   Objective:   BP 104/78 (BP Location: Right Arm, Patient Position: Sitting, Cuff Size: Normal)   Pulse 99   Temp 98.3 F (36.8 C) (Oral)   Resp 16   Wt 128 lb (58.1 kg)   SpO2 96%   BMI 21.30 kg/m    Physical Exam   General Appearance:    Alert, cooperative, no distress  Eyes:  PERRL, conjunctiva/corneas clear, EOM's intact       Lungs:     Clear to auscultation bilaterally, respirations unlabored  Heart:    Regular rate and rhythm  Neurologic:   Awake, alert, oriented x 3. No apparent focal neurological           defect.           Assessment & Plan:           Lelon Huh, MD  [facility]

## 2017-04-29 LAB — COMPREHENSIVE METABOLIC PANEL
A/G RATIO: 1.9 (ref 1.2–2.2)
ALBUMIN: 4.6 g/dL (ref 3.5–5.5)
ALT: 15 IU/L (ref 0–32)
AST: 20 IU/L (ref 0–40)
Alkaline Phosphatase: 73 IU/L (ref 39–117)
BILIRUBIN TOTAL: 0.3 mg/dL (ref 0.0–1.2)
BUN/Creatinine Ratio: 8 — ABNORMAL LOW (ref 9–23)
BUN: 8 mg/dL (ref 6–20)
CHLORIDE: 100 mmol/L (ref 96–106)
CO2: 23 mmol/L (ref 20–29)
Calcium: 9.8 mg/dL (ref 8.7–10.2)
Creatinine, Ser: 1.03 mg/dL — ABNORMAL HIGH (ref 0.57–1.00)
GFR calc non Af Amer: 69 mL/min/{1.73_m2} (ref >59)
GFR, EST AFRICAN AMERICAN: 80 mL/min/{1.73_m2} (ref >59)
Globulin, Total: 2.4 g/dL (ref 1.5–4.5)
Glucose: 89 mg/dL (ref 65–99)
POTASSIUM: 4.1 mmol/L (ref 3.5–5.2)
Sodium: 139 mmol/L (ref 134–144)
TOTAL PROTEIN: 7 g/dL (ref 6.0–8.5)

## 2017-04-29 LAB — FSH/LH
FSH: 5.3 m[IU]/mL
LH: 7.8 m[IU]/mL

## 2017-04-29 LAB — LIPID PANEL
Chol/HDL Ratio: 3.3 ratio (ref 0.0–4.4)
Cholesterol, Total: 214 mg/dL — ABNORMAL HIGH (ref 100–199)
HDL: 64 mg/dL (ref >39)
LDL CALC: 125 mg/dL — AB (ref 0–99)
Triglycerides: 125 mg/dL (ref 0–149)
VLDL CHOLESTEROL CAL: 25 mg/dL (ref 5–40)

## 2017-04-29 LAB — TSH: TSH: 1.35 u[IU]/mL (ref 0.450–4.500)

## 2017-04-29 LAB — T4, FREE: FREE T4: 1.09 ng/dL (ref 0.82–1.77)

## 2017-04-29 LAB — SEDIMENTATION RATE: Sed Rate: 2 mm/hr (ref 0–32)

## 2017-05-01 ENCOUNTER — Encounter: Payer: Self-pay | Admitting: Family Medicine

## 2017-07-30 ENCOUNTER — Encounter: Payer: Self-pay | Admitting: Family Medicine

## 2017-07-30 DIAGNOSIS — M79673 Pain in unspecified foot: Secondary | ICD-10-CM

## 2017-09-13 ENCOUNTER — Other Ambulatory Visit: Payer: Self-pay | Admitting: Family Medicine

## 2017-09-13 DIAGNOSIS — F410 Panic disorder [episodic paroxysmal anxiety] without agoraphobia: Secondary | ICD-10-CM

## 2017-10-13 ENCOUNTER — Other Ambulatory Visit: Payer: Self-pay | Admitting: Obstetrics & Gynecology

## 2017-10-13 ENCOUNTER — Ambulatory Visit
Admission: RE | Admit: 2017-10-13 | Discharge: 2017-10-13 | Disposition: A | Discharge status: Home / Self Care | Payer: No Typology Code available for payment source | Source: Ambulatory Visit | Attending: Obstetrics & Gynecology | Admitting: Obstetrics & Gynecology

## 2017-10-13 DIAGNOSIS — Z1231 Encounter for screening mammogram for malignant neoplasm of breast: Secondary | ICD-10-CM | POA: Diagnosis not present

## 2017-11-04 HISTORY — PX: AUGMENTATION MAMMAPLASTY: SUR837

## 2018-02-18 ENCOUNTER — Encounter: Payer: Self-pay | Admitting: Obstetrics & Gynecology

## 2018-02-18 ENCOUNTER — Ambulatory Visit (INDEPENDENT_AMBULATORY_CARE_PROVIDER_SITE_OTHER): Payer: No Typology Code available for payment source | Admitting: Obstetrics & Gynecology

## 2018-02-18 VITALS — BP 100/60 | Ht 65.0 in | Wt 125.0 lb

## 2018-02-18 DIAGNOSIS — Z01419 Encounter for gynecological examination (general) (routine) without abnormal findings: Secondary | ICD-10-CM

## 2018-02-18 DIAGNOSIS — Z0000 Encounter for general adult medical examination without abnormal findings: Secondary | ICD-10-CM

## 2018-02-18 DIAGNOSIS — Z8639 Personal history of other endocrine, nutritional and metabolic disease: Secondary | ICD-10-CM

## 2018-02-18 NOTE — Progress Notes (Signed)
HPI:      [name] is a 37 y.o. [phone] who LMP was No LMP recorded. Patient has had a hysterectomy., she presents today for her annual examination. The patient has no complaints today. The patient is sexually active. Her last pap: approximate date 2016 and was normal and last mammogram: approximate date 2019 and was normal. The patient does perform self breast exams.  There is no notable family history of breast or ovarian cancer in her family.  The patient has regular exercise: yes.  The patient denies current symptoms of depression.   Implants (breast) since last visit    MMG prior to surgery Prior h/o breast adenoma High cholesterol treated w diet, exercise, omega 3 (and red yeast rice last year, not now)  PMHx: Past Medical History:  Diagnosis Date  . History of chicken pox    Past Surgical History:  Procedure Laterality Date  . ABDOMINAL HYSTERECTOMY  2008   for excessive bleeding  . 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 CERVICAL BIOPSY  W/ LOOP ELECTRODE EXCISION    . COLPOSCOPY    . TONSILLECTOMY AND ADENOIDECTOMY  1999  . TUBAL LIGATION  2007   Family History  Problem Relation Age of Onset  . CAD Father   . Heart attack Father        died age 99, waw smoker  . Diabetes Maternal Grandmother   . Diabetes Maternal Grandfather    Social History   Tobacco Use  . Smoking status: Never Smoker  . Smokeless tobacco: Never Used  Substance Use Topics  . Alcohol use: Yes    Alcohol/week: 0.0 standard drinks    Comment: occasional use  . Drug use: No  Works at [employer]  Current Outpatient Medications:  .  ALPRAZolam (XANAX) 0.5 MG tablet, TAKE 1 TO 2 TABLETS BY MOUTH TWICE A DAY AS NEEDED FOR PANIC ATTACKS, Disp: 30 tablet, Rfl: 5 .  ALPRAZolam (XANAX) 0.5 MG tablet, , Disp: , Rfl: 5 .  Biotin 5 MG CAPS, Take by mouth., Disp: , Rfl:  .  CALCIUM PO, Take by mouth., Disp: , Rfl:  .  methylPREDNISolone (MEDROL DOSEPAK) 4 MG TBPK tablet, Take  Tapered dose as directed (Patient not taking: Reported on 04/28/2017), Disp: 21 tablet, Rfl: 0 .  Multiple Vitamins-Minerals (MULTIVITAMIN ADULT PO), Take 1 tablet by mouth daily., Disp: , Rfl:  .  Omega-3 Fatty Acids (FISH OIL PO), Take by mouth., Disp: , Rfl:  .  PARoxetine (PAXIL) 20 MG tablet, TAKE 1 TABLET (20 MG TOTAL) BY MOUTH DAILY., Disp: 30 tablet, Rfl: 3 .  pseudoephedrine (SUDAFED 12 HOUR) 120 MG 12 hr tablet, Take 1 tablet (120 mg total) by mouth 2 (two) times daily. (Patient not taking: Reported on 04/28/2017), Disp: 20 tablet, Rfl: 0 .  Red Yeast Rice Extract (RED YEAST RICE PO), Take by mouth., Disp: , Rfl:  Allergies: Singulair  [montelukast sodium] and Etodolac  Review of Systems  Constitutional: Negative for chills, fever and malaise/fatigue.  HENT: Negative for congestion, sinus pain and sore throat.   Eyes: Negative for blurred vision and pain.  Respiratory: Negative for cough and wheezing.   Cardiovascular: Negative for chest pain and leg swelling.  Gastrointestinal: Negative for abdominal pain, constipation, diarrhea, heartburn, nausea and vomiting.  Genitourinary: Negative for dysuria, frequency, hematuria and urgency.  Musculoskeletal: Negative for back pain, joint pain, myalgias and neck pain.  Skin: Negative for itching and rash.  Neurological: Negative  for dizziness, tremors and weakness.  Endo/Heme/Allergies: Does not bruise/bleed easily.  Psychiatric/Behavioral: Negative for depression. The patient is not nervous/anxious and does not have insomnia.     Objective: BP 100/60   Ht 5\' 5"  (1.651 m)   Wt 125 lb (56.7 kg)   BMI 20.80 kg/m   Filed Weights   02/18/18 0808  Weight: 125 lb (56.7 kg)   Body mass index is 20.8 kg/m. Physical Exam  Constitutional: She is oriented to person, place, and time. She appears well-developed and well-nourished. No distress.  Genitourinary: Rectum normal and vagina normal. Pelvic exam was performed with patient supine. There  is no rash or lesion on the right labia. There is no rash or lesion on the left labia. Vagina exhibits no lesion. No bleeding in the vagina. Right adnexum does not display mass and does not display tenderness. Left adnexum does not display mass and does not display tenderness.  Genitourinary Comments: Absent Uterus Absent cervix Vaginal cuff well healed  HENT:  Head: Normocephalic and atraumatic. Head is without laceration.  Right Ear: Hearing normal.  Left Ear: Hearing normal.  Nose: No epistaxis.  No foreign bodies.  Mouth/Throat: Uvula is midline, oropharynx is clear and moist and mucous membranes are normal.  Eyes: Pupils are equal, round, and reactive to light.  Neck: Normal range of motion. Neck supple. No thyromegaly present.  Cardiovascular: Normal rate and regular rhythm. Exam reveals no gallop and no friction rub.  No murmur heard. Pulmonary/Chest: Effort normal and breath sounds normal. No respiratory distress. She has no wheezes. Right breast exhibits no mass, no skin change and no tenderness. Left breast exhibits no mass, no skin change and no tenderness.  Abdominal: Soft. Bowel sounds are normal. She exhibits no distension. There is no tenderness. There is no rebound.  Musculoskeletal: Normal range of motion.  Neurological: She is alert and oriented to person, place, and time. No cranial nerve deficit.  Skin: Skin is warm and dry.  Psychiatric: She has a normal mood and affect. Judgment normal.  Vitals reviewed.  Assessment:  ANNUAL EXAM 1. Annual physical exam   2. History of hypercholesterolemia    Screening Plan:            1.  Cervical Screening-  Pap smear schedule reviewed with patient, due 2021 (every 5 years s/p hyst)  2. Breast screening- Exam annually and mammogram>40 planned   3. Labs To return fasting at a later date to recheck cholesterol.  Is on Omega 3 and better diet, no longer taking red yeast rice (that did seem to lower on recheck last year).   If high  again then restart red yeast rice.  4. Counseling for contraception: no method needed    F/U  Return in about 1 year (around 02/19/2019) for Annual.  Barnett Applebaum, MD, [facility], [facility] 02/18/2018  8:13 AM

## 2018-02-18 NOTE — Patient Instructions (Signed)
PAP every 5 years Mammogram every year starting at age 38 Labs yearly (with PCP)

## 2018-03-17 ENCOUNTER — Other Ambulatory Visit: Payer: Self-pay | Admitting: Family Medicine

## 2018-03-17 MED ORDER — ALPRAZOLAM 0.5 MG PO TABS: 0.5000 mg | ORAL_TABLET | Freq: Two times a day (BID) | ORAL | 5 refills | Status: DC | PRN

## 2018-03-17 NOTE — Telephone Encounter (Signed)
[facility] faxed refill request for the following medications:  ALPRAZolam (XANAX) 0.5 MG tablet  Last Rx: 09/13/17 Please advise. Thanks TNP

## 2018-03-18 ENCOUNTER — Other Ambulatory Visit: Payer: No Typology Code available for payment source

## 2018-03-18 DIAGNOSIS — Z8639 Personal history of other endocrine, nutritional and metabolic disease: Secondary | ICD-10-CM

## 2018-03-19 LAB — LIPID PANEL
Chol/HDL Ratio: 3.2 ratio (ref 0.0–4.4)
Cholesterol, Total: 208 mg/dL — ABNORMAL HIGH (ref 100–199)
HDL: 66 mg/dL (ref >39)
LDL Calculated: 130 mg/dL — ABNORMAL HIGH (ref 0–99)
Triglycerides: 61 mg/dL (ref 0–149)
VLDL Cholesterol Cal: 12 mg/dL (ref 5–40)

## 2018-03-24 ENCOUNTER — Encounter: Payer: Self-pay | Admitting: Obstetrics & Gynecology

## 2018-06-29 IMAGING — MR MR SHOULDER*L* W/ CM
6 series · 40 of 40 positions shown · IV contrast (agent unspecified)
Comparison: None.

CLINICAL DATA: Injured shoulder 3 months ago while pulling a shirt
overhead. Persistent pain and limited range of motion since then.

EXAM:
MR ARTHROGRAM OF THE left SHOULDER
TECHNIQUE: Multiplanar, multisequence MR imaging of the left shoulder was
performed following the administration of intra-articular contrast.
CONTRAST:  See Injection Documentation.

[Series 3: T1 fat-sat · axial · 4.0mm · 0.47mm/px · z∈[-27,+74]mm · 8 of 24 slices shown (1 of 4)]
[im 1/24]
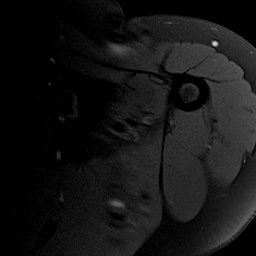
[im 4/24]
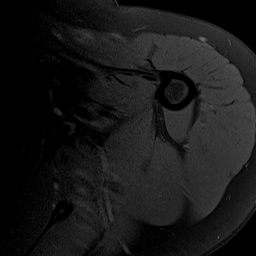
[im 7/24]
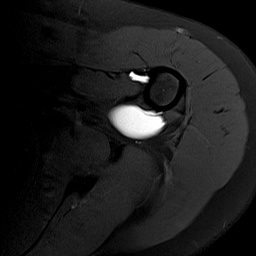
[im 10/24]
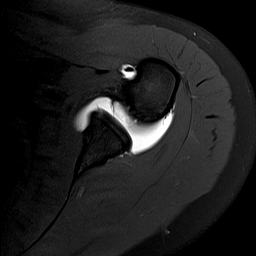
[im 14/24]
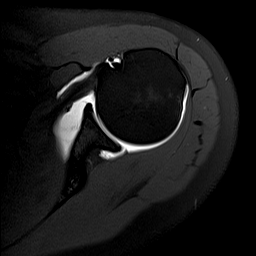
[im 17/24]
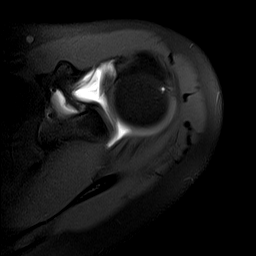
[im 20/24]
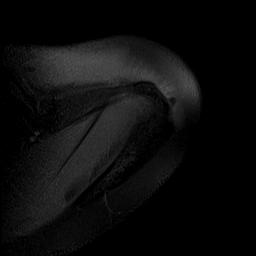
[im 24/24]
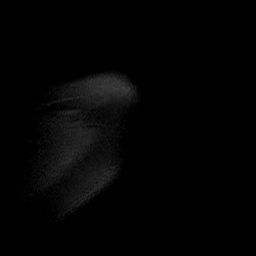

[Series 4: T1 fat-sat · oblique · 4.0mm · 0.62mm/px · 6 of 20 slices shown (2 of 4)]
[im 1/20]
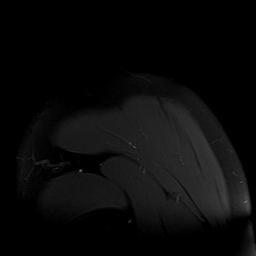
[im 4/20]
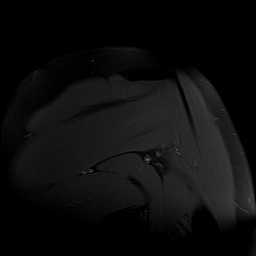
[im 8/20]
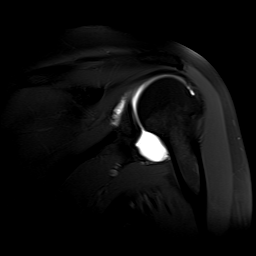
[im 12/20]
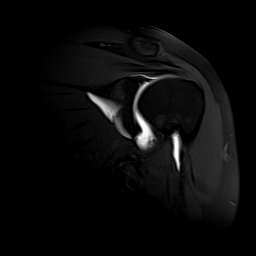
[im 16/20]
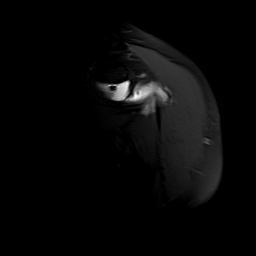
[im 20/20]
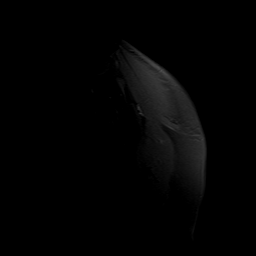

[Series 5: T2 fat-sat · oblique · 4.0mm · 0.62mm/px · 6 of 20 slices shown (1 of 2)]
[im 1/20]
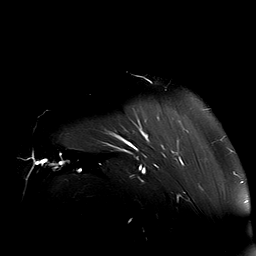
[im 4/20]
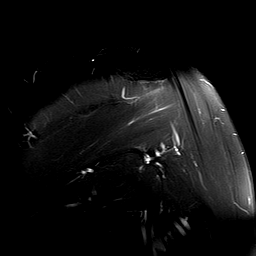
[im 8/20]
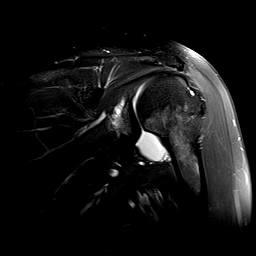
[im 12/20]
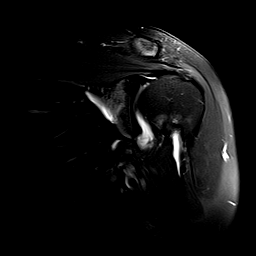
[im 16/20]
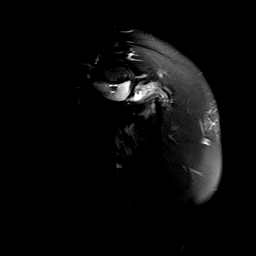
[im 20/20]
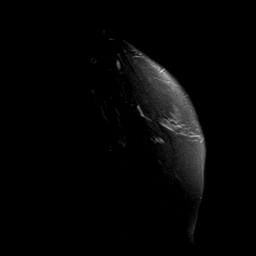

[Series 6: T1 fat-sat · oblique · non-contrast · 4.0mm · 0.42mm/px · 6 of 20 slices shown (3 of 4)]
[im 1/20]
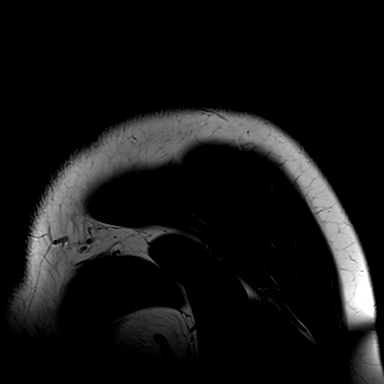
[im 4/20]
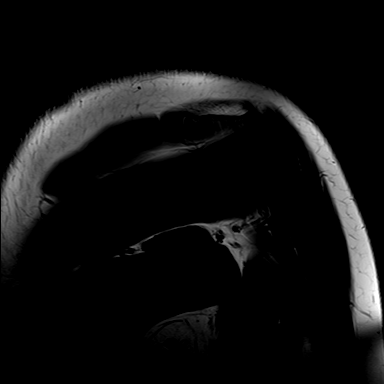
[im 8/20]
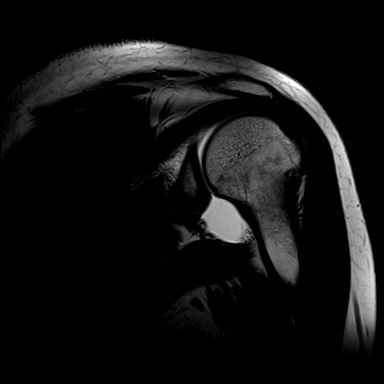
[im 12/20]
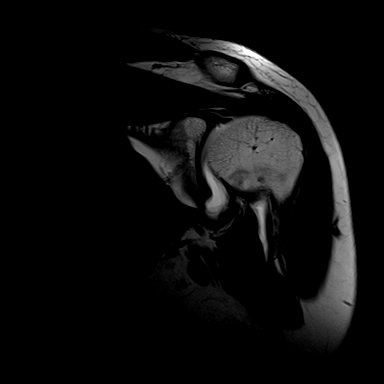
[im 16/20]
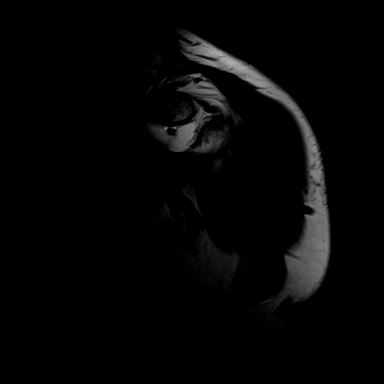
[im 20/20]
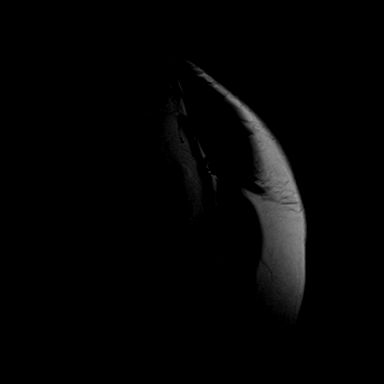

[Series 7: T2 fat-sat · oblique · 4.0mm · 0.62mm/px · 7 of 22 slices shown (2 of 2)]
[im 1/22]
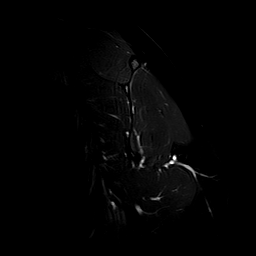
[im 4/22]
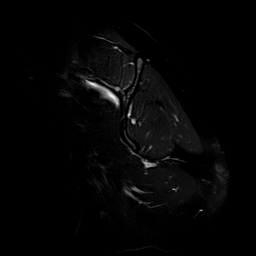
[im 8/22]
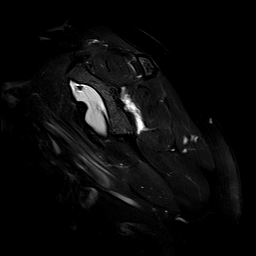
[im 11/22]
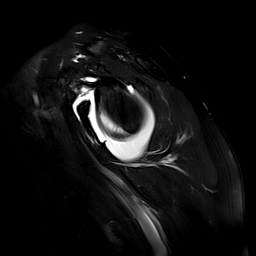
[im 15/22]
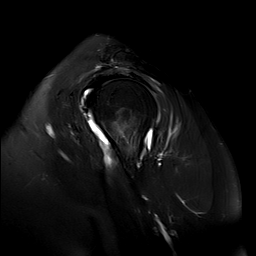
[im 18/22]
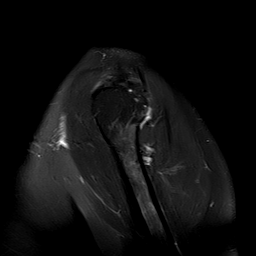
[im 22/22]
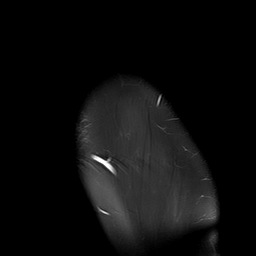

[Series 10: T1 fat-sat · sagittal · 4.0mm · 0.62mm/px · 7 of 22 slices shown (4 of 4)]
[im 1/22]
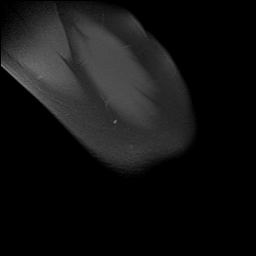
[im 4/22]
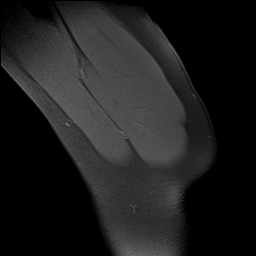
[im 8/22]
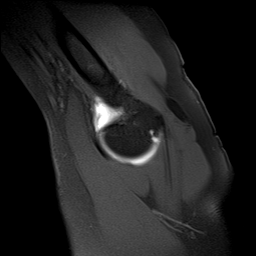
[im 11/22]
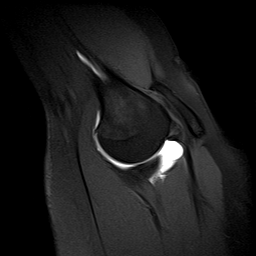
[im 15/22]
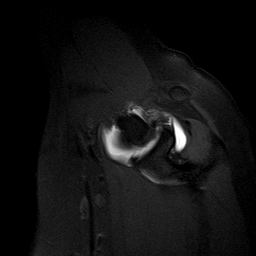
[im 18/22]
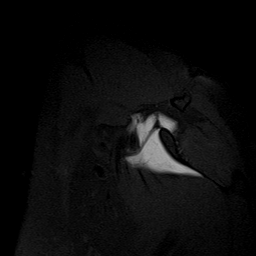
[im 22/22]
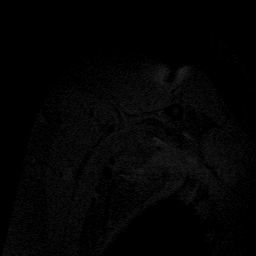

[40 of 40 positions shown; findings below may reference images not displayed]

FINDINGS: Rotator cuff: Moderate supraspinous and infraspinatus tendinopathy.
This is most significant along the anterior air aspect of the
supraspinatus tendon. Suspect interstitial tearing. There is also
bursal surface fraying but no obvious partial or full-thickness
tear. The subscapularis tendon appears normal.

Muscles: Normal

Biceps long head: Intact

Acromioclavicular Joint: No degenerative changes. The acromion is
type 2 in shape. There is significant lateral downsloping but no
undersurface spurring.

Glenohumeral Joint: Normal articular cartilage. No degenerative
changes.

Labrum: The glenoid labrum, glenohumeral ligaments and anterior
labrocapsular complex are intact.

Bones: No acute bony findings.
IMPRESSION: 1. Moderate supraspinatus and infraspinatus tendinopathy/tendinosis
with interstitial tears and bursal surface fraying and fibrillation.
Shallow bursal surface tears are possible. No full-thickness
retracted tear.
2. Normal with long head biceps tendon, glenoid labrum, glenohumeral
ligaments and anterior labrocapsular complex.
3. Significant lateral downsloping of a type 2 acromion possibly
contributing to bony impingement.

## 2018-06-30 ENCOUNTER — Encounter: Payer: Self-pay | Admitting: Family Medicine

## 2018-06-30 MED FILL — ALPRAZolam 0.5 MG TABS: 0.5 | 7 days supply | Qty: 30 | Fill #0

## 2018-07-01 ENCOUNTER — Telehealth: Payer: No Typology Code available for payment source | Admitting: Nurse Practitioner

## 2018-07-01 DIAGNOSIS — Z20822 Contact with and (suspected) exposure to covid-19: Secondary | ICD-10-CM

## 2018-07-01 DIAGNOSIS — R6889 Other general symptoms and signs: Principal | ICD-10-CM

## 2018-07-01 NOTE — Progress Notes (Signed)
E-Visit for Corona Virus Screening  Based on your current symptoms, you may very well have the virus, however your symptoms are mild. Currently, not all patients are being tested. If the symptoms are mild and there is not a known exposure, performing the test is not indicated.  Coronavirus disease 2019 (COVID-19) is a respiratory illness that can spread from person to person. The virus that causes COVID-19 is a new virus that was first identified in the country of [place] but is now found in multiple other countries and has spread to the [place].  Symptoms associated with the virus are mild to severe fever, cough, and shortness of breath. There is currently no vaccine to protect against COVID-19, and there is no specific antiviral treatment for the virus.   To be considered HIGH RISK for Coronavirus (COVID-19), you have to meet the following criteria:  . Traveled to [place], [place], [place], [place] or [place]; or in the [place] to [place], [place], [place], or [place]; and have fever, cough, and shortness of breath within the last 2 weeks of travel OR  . Been in close contact with a person diagnosed with COVID-19 within the last 2 weeks and have fever, cough, and shortness of breath  . IF YOU DO NOT MEET THESE CRITERIA, YOU ARE CONSIDERED LOW RISK FOR COVID-19.   It is vitally important that if you feel that you have an infection such as this virus or any other virus that you stay home and away from places where you may spread it to others.  You should self-quarantine for 14 days if you have symptoms that could potentially be coronavirus and avoid contact with people age 39 and older.   You can use medication such as delsym or mucinx OTC  You may also take acetaminophen (Tylenol) as needed for fever.   Reduce your risk of any infection by using the same precautions used for avoiding the common cold or flu:  Marland Kitchen Wash your hands often with soap and warm water for at least 20 seconds.   If soap and water are not readily available, use an alcohol-based hand sanitizer with at least 60% alcohol.  . If coughing or sneezing, cover your mouth and nose by coughing or sneezing into the elbow areas of your shirt or coat, into a tissue or into your sleeve (not your hands). . Avoid shaking hands with others and consider head nods or verbal greetings only. . Avoid touching your eyes, nose, or mouth with unwashed hands.  . Avoid close contact with people who are sick. . Avoid places or events with large numbers of people in one location, like concerts or sporting events. . Carefully consider travel plans you have or are making. . If you are planning any travel outside or inside the [place], visit the CDC's Travelers' Health webpage for the latest health notices. . If you have some symptoms but not all symptoms, continue to monitor at home and seek medical attention if your symptoms worsen. . If you are having a medical emergency, call 911.  HOME CARE . Only take medications as instructed by your medical team. . Drink plenty of fluids and get plenty of rest. . A steam or ultrasonic humidifier can help if you have congestion.   GET HELP RIGHT AWAY IF: . You develop worsening fever. . You become short of breath . You cough up blood. . Your symptoms become more severe MAKE SURE YOU   Understand these instructions.  Will watch your condition.  Will get help right away if you are not doing well or get worse.  Your e-visit answers were reviewed by a board certified advanced clinical practitioner to complete your personal care plan.  Depending on the condition, your plan could have included both over the counter or prescription medications.  If there is a problem please reply once you have received a response from your provider. Your safety is important to [place].  If you have drug allergies check your prescription carefully.    You can use MyChart to ask questions about today's visit, request a  non-urgent call back, or ask for a work or school excuse for 24 hours related to this e-Visit. If it has been greater than 24 hours you will need to follow up with your provider, or enter a new e-Visit to address those concerns. You will get an e-mail in the next two days asking about your experience.  I hope that your e-visit has been valuable and will speed your recovery. Thank you for using e-visits.  5 minutes spent reviewing and documenting in chart.

## 2018-07-18 ENCOUNTER — Encounter: Payer: Self-pay | Admitting: Family Medicine

## 2018-07-18 ENCOUNTER — Ambulatory Visit (INDEPENDENT_AMBULATORY_CARE_PROVIDER_SITE_OTHER): Payer: No Typology Code available for payment source | Admitting: Family Medicine

## 2018-07-18 ENCOUNTER — Other Ambulatory Visit: Payer: Self-pay

## 2018-07-18 VITALS — BP 118/83 | HR 89 | Temp 98.3°F | Wt 123.0 lb

## 2018-07-18 DIAGNOSIS — B029 Zoster without complications: Secondary | ICD-10-CM

## 2018-07-18 DIAGNOSIS — M792 Neuralgia and neuritis, unspecified: Secondary | ICD-10-CM

## 2018-07-18 MED ORDER — VALACYCLOVIR HCL 1 G PO TABS: 1000.0000 mg | ORAL_TABLET | Freq: Three times a day (TID) | ORAL | 0 refills | Status: DC

## 2018-07-18 MED ORDER — AMITRIPTYLINE HCL 10 MG PO TABS: ORAL_TABLET | ORAL | 0 refills | Status: DC

## 2018-07-18 NOTE — Patient Instructions (Signed)
.   Please review the attached list of medications and notify my office if there are any errors.   . Please bring all of your medications to every appointment so we can make sure that our medication list is the same as yours.   

## 2018-07-18 NOTE — Progress Notes (Signed)
Patient: Heidi Guerra Female    DOB: 10-22-1980   38 y.o.   MRN: [id number] Visit Date: 07/18/2018  Today's Provider: Lelon Huh, MD   Chief Complaint  Patient presents with  . Rash    started 10 days ago.   Subjective:     Rash  This is a new problem. Episode onset: Pt stated 07/08/2018. The problem is unchanged. The affected locations include the right buttock. The rash is characterized by blistering, pain and redness. She was exposed to nothing. Pertinent negatives include no congestion, cough, facial edema, fatigue, fever, joint pain, shortness of breath, sore throat or vomiting. Past treatments include anti-itch cream. The treatment provided mild relief.  Was painful and tingly from the very start. Blisters are now mostly crusted over, but is still very painful and keeping up at night. She brings photo of rash from first few days showing about 8 vesicals on erythematous base about 5x5 cm c/w Zoster.   Allergies  Allergen Reactions  . Singulair  [Montelukast Sodium]     Rash  . Etodolac Rash     Current Outpatient Medications:  .  ALPRAZolam (XANAX) 0.5 MG tablet, Take 1-2 tablets (0.5-1 mg total) by mouth 2 (two) times daily as needed for anxiety (panic attacks)., Disp: 30 tablet, Rfl: 5 .  Biotin 5 MG CAPS, Take by mouth., Disp: , Rfl:  .  CALCIUM PO, Take by mouth., Disp: , Rfl:  .  Multiple Vitamins-Minerals (MULTIVITAMIN ADULT PO), Take 1 tablet by mouth daily., Disp: , Rfl:  .  methylPREDNISolone (MEDROL DOSEPAK) 4 MG TBPK tablet, Take Tapered dose as directed (Patient not taking: Reported on 04/28/2017), Disp: 21 tablet, Rfl: 0 .  Omega-3 Fatty Acids (FISH OIL PO), Take by mouth., Disp: , Rfl:  .  PARoxetine (PAXIL) 20 MG tablet, TAKE 1 TABLET (20 MG TOTAL) BY MOUTH DAILY., Disp: 30 tablet, Rfl: 3 .  pseudoephedrine (SUDAFED 12 HOUR) 120 MG 12 hr tablet, Take 1 tablet (120 mg total) by mouth 2 (two) times daily. (Patient not taking: Reported on 04/28/2017),  Disp: 20 tablet, Rfl: 0 .  Red Yeast Rice Extract (RED YEAST RICE PO), Take by mouth., Disp: , Rfl:   Review of Systems  Constitutional: Negative.  Negative for fatigue and fever.  HENT: Negative for congestion and sore throat.   Respiratory: Negative for cough and shortness of breath.   Gastrointestinal: Negative for vomiting.  Musculoskeletal: Negative for joint pain.  Skin: Positive for rash. Negative for color change, pallor and wound.  Neurological: Negative for dizziness, light-headedness and headaches.    Social History   Tobacco Use  . Smoking status: Never Smoker  . Smokeless tobacco: Never Used  Substance Use Topics  . Alcohol use: Yes    Alcohol/week: 0.0 standard drinks    Comment: occasional use      Objective:   BP 118/83 (BP Location: Right Arm, Patient Position: Sitting, Cuff Size: Normal)   Pulse 89   Temp 98.3 F (36.8 C) (Oral)   Wt 123 lb (55.8 kg)   BMI 20.47 kg/m  Vitals:   07/18/18 1345  BP: 118/83  Pulse: 89  Temp: 98.3 F (36.8 C)  TempSrc: Oral  Weight: 123 lb (55.8 kg)     Physical Exam  General appearance: alert, well developed, well nourished, cooperative and in no distress Head: Normocephalic, without obvious abnormality, atraumatic Respiratory: Respirations even and unlabored, normal respiratory rate Extremities: No gross deformities Skin: crusted lesions on  rash on right upper buttocks distributed as described in HPI.      Assessment & Plan    1. Herpes zoster without complication Advised of limited benefit of antivirals nearly 2 weeks after onset of rash. She is agreeable to to take course but is to start today.  - valACYclovir (VALTREX) 1000 MG tablet; Take 1 tablet (1,000 mg total) by mouth every 8 (eight) hours for 7 days.  Dispense: 21 tablet; Refill: 0  2. Neuralgia Lesions have now crusted over. Can apply OTC lidocaine to any area, so long as skin is intact and all lesions remain crusted over. Prescription -  amitriptyline (ELAVIL) 10 MG tablet; Take 1-2 tablets every evening before bed for shingles pain  Dispense: 30 tablet; Refill: 0     Lelon Huh, MD  [facility]

## 2018-08-05 MED FILL — ALPRAZolam 0.5 MG TABS: 0.5 | 7 days supply | Qty: 30 | Fill #1

## 2018-08-17 MED FILL — ALPRAZolam 0.5 MG TABS: 0.5 | 7 days supply | Qty: 30 | Fill #1

## 2018-09-14 ENCOUNTER — Other Ambulatory Visit: Payer: Self-pay | Admitting: Family Medicine

## 2018-09-14 MED ORDER — ALPRAZOLAM 0.5 MG PO TABS: 0.5000 mg | ORAL_TABLET | Freq: Two times a day (BID) | ORAL | 5 refills | Status: DC | PRN

## 2018-09-14 NOTE — Telephone Encounter (Signed)
[facility] faxed refill request for the following medications:  ALPRAZolam (XANAX) 0.5 MG tablet   Please advise.  Thanks, [employer]

## 2018-09-15 MED FILL — ALPRAZolam 0.5 MG TABS: 0.5 | 7 days supply | Qty: 30 | Fill #0

## 2018-11-17 ENCOUNTER — Encounter: Payer: Self-pay | Admitting: Family Medicine

## 2019-02-24 ENCOUNTER — Ambulatory Visit (INDEPENDENT_AMBULATORY_CARE_PROVIDER_SITE_OTHER): Payer: No Typology Code available for payment source | Admitting: Obstetrics & Gynecology

## 2019-02-24 ENCOUNTER — Encounter: Payer: Self-pay | Admitting: Obstetrics & Gynecology

## 2019-02-24 ENCOUNTER — Other Ambulatory Visit: Payer: Self-pay

## 2019-02-24 VITALS — BP 100/70 | Ht 65.0 in | Wt 119.0 lb

## 2019-02-24 DIAGNOSIS — B029 Zoster without complications: Secondary | ICD-10-CM

## 2019-02-24 DIAGNOSIS — Z01419 Encounter for gynecological examination (general) (routine) without abnormal findings: Secondary | ICD-10-CM | POA: Diagnosis not present

## 2019-02-24 DIAGNOSIS — Z1231 Encounter for screening mammogram for malignant neoplasm of breast: Secondary | ICD-10-CM

## 2019-02-24 DIAGNOSIS — Z8639 Personal history of other endocrine, nutritional and metabolic disease: Secondary | ICD-10-CM

## 2019-02-24 MED ORDER — VALACYCLOVIR HCL 1 G PO TABS: 1000.0000 mg | ORAL_TABLET | Freq: Three times a day (TID) | ORAL | 1 refills | Status: AC

## 2019-02-24 NOTE — Patient Instructions (Signed)
PAP every 5 years Mammogram every year    Call [phone] to schedule at [facility] today - cholesterol

## 2019-02-24 NOTE — Progress Notes (Signed)
HPI:      [name] is a 38 y.o. [phone] who LMP was No LMP recorded. Patient has had a hysterectomy., she presents today for her annual examination. The patient has no complaints today. The patient is sexually active. Her last pap: approximate date 2016 and was normal. The patient does perform self breast exams.  There is no notable family history of breast or ovarian cancer in her family.  The patient has regular exercise: yes.  The patient denies current symptoms of depression.   Pt had outbreak of shingles herpes zoster last April (following having the flu).  GYN History: Contraception: status post hysterectomy  PMHx: Past Medical History:  Diagnosis Date  . History of chicken pox    Past Surgical History:  Procedure Laterality Date  . ABDOMINAL HYSTERECTOMY  2008   for excessive bleeding  . 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 CERVICAL BIOPSY  W/ LOOP ELECTRODE EXCISION    . COLPOSCOPY    . TONSILLECTOMY AND ADENOIDECTOMY  1999  . TUBAL LIGATION  2007   Family History  Problem Relation Age of Onset  . CAD Father   . Heart attack Father        died age 77, waw smoker  . Diabetes Maternal Grandmother   . Diabetes Maternal Grandfather    Social History   Tobacco Use  . Smoking status: Never Smoker  . Smokeless tobacco: Never Used  Substance Use Topics  . Alcohol use: Yes    Alcohol/week: 0.0 standard drinks    Comment: occasional use  . Drug use: No    Current Outpatient Medications:  .  ALPRAZolam (XANAX) 0.5 MG tablet, TAKE 1-2 TABLETS BY MOUTH 2 TIMES DAILY AS NEEDED FOR ANXIETY (PANIC ATTACKS)., Disp: 30 tablet, Rfl: 3 .  Biotin 5 MG CAPS, Take by mouth., Disp: , Rfl:  .  CALCIUM PO, Take by mouth., Disp: , Rfl:  .  Multiple Vitamins-Minerals (MULTIVITAMIN ADULT PO), Take 1 tablet by mouth daily., Disp: , Rfl:  .  Omega-3 Fatty Acids (FISH OIL PO), Take by mouth., Disp: , Rfl:  .  valACYclovir (VALTREX) 1000 MG tablet, Take 1 tablet  (1,000 mg total) by mouth 3 (three) times daily for 7 days. For episode of Zoster., Disp: 21 tablet, Rfl: 1 Allergies: Singulair  [montelukast sodium] and Etodolac  Review of Systems  Constitutional: Negative for chills, fever and malaise/fatigue.  HENT: Negative for congestion, sinus pain and sore throat.   Eyes: Negative for blurred vision and pain.  Respiratory: Negative for cough and wheezing.   Cardiovascular: Negative for chest pain and leg swelling.  Gastrointestinal: Negative for abdominal pain, constipation, diarrhea, heartburn, nausea and vomiting.  Genitourinary: Negative for dysuria, frequency, hematuria and urgency.  Musculoskeletal: Negative for back pain, joint pain, myalgias and neck pain.  Skin: Negative for itching and rash.  Neurological: Negative for dizziness, tremors and weakness.  Endo/Heme/Allergies: Does not bruise/bleed easily.  Psychiatric/Behavioral: Negative for depression. The patient is not nervous/anxious and does not have insomnia.     Objective: BP 100/70   Ht 5\' 5"  (1.651 m)   Wt 119 lb (54 kg)   BMI 19.80 kg/m   Filed Weights   02/24/19 0802  Weight: 119 lb (54 kg)   Body mass index is 19.8 kg/m. Physical Exam Constitutional:      General: She is not in acute distress.    Appearance: She is well-developed.  Genitourinary:     Pelvic exam  was performed with patient supine.     Vagina and rectum normal.     No lesions in the vagina.     No vaginal bleeding.     No right or left adnexal mass present.     Right adnexa not tender.     Left adnexa not tender.     Genitourinary Comments: Absent Uterus Absent cervix Vaginal cuff well healed  HENT:     Head: Normocephalic and atraumatic. No laceration.     Right Ear: Hearing normal.     Left Ear: Hearing normal.     Mouth/Throat:     Pharynx: Uvula midline.  Eyes:     Pupils: Pupils are equal, round, and reactive to light.  Neck:     Musculoskeletal: Normal range of motion and neck  supple.     Thyroid: No thyromegaly.  Cardiovascular:     Rate and Rhythm: Normal rate and regular rhythm.     Heart sounds: No murmur. No friction rub. No gallop.   Pulmonary:     Effort: Pulmonary effort is normal. No respiratory distress.     Breath sounds: Normal breath sounds. No wheezing.  Chest:     Breasts:        Right: No mass, skin change or tenderness.        Left: No mass, skin change or tenderness.  Abdominal:     General: Bowel sounds are normal. There is no distension.     Palpations: Abdomen is soft.     Tenderness: There is no abdominal tenderness. There is no rebound.  Musculoskeletal: Normal range of motion.  Neurological:     Mental Status: She is alert and oriented to person, place, and time.     Cranial Nerves: No cranial nerve deficit.  Skin:    General: Skin is warm and dry.  Psychiatric:        Judgment: Judgment normal.  Vitals signs reviewed.     Assessment:  ANNUAL EXAM 1. Women's annual routine gynecological examination   2. Encounter for screening mammogram for malignant neoplasm of breast   3. History of hypercholesterolemia   4. Herpes zoster without complication      Screening Plan:            1.  Cervical Screening-  Pap smear schedule reviewed with patient, due 2021  2. Breast screening- Exam annually and mammogram>40 planned (Plan MMG in Jan when turns 39)  3.  Labs Ordered today  4. Counseling for contraception: none needed   5. Herpes zoster without complication - Have medicine available in case of recurrence - valACYclovir (VALTREX) 1000 MG tablet; Take 1 tablet (1,000 mg total) by mouth 3 (three) times daily for 7 days. For episode of Zoster.  Dispense: 21 tablet; Refill: 1      F/U  Return in about 1 year (around 02/24/2020) for Annual.  Barnett Applebaum, MD, [facility], [facility] 02/24/2019  8:19 AM

## 2019-02-25 LAB — LIPID PANEL
Chol/HDL Ratio: 2.8 ratio (ref 0.0–4.4)
Cholesterol, Total: 159 mg/dL (ref 100–199)
HDL: 57 mg/dL (ref >39)
LDL Chol Calc (NIH): 89 mg/dL (ref 0–99)
Triglycerides: 67 mg/dL (ref 0–149)
VLDL Cholesterol Cal: 13 mg/dL (ref 5–40)

## 2019-03-24 ENCOUNTER — Other Ambulatory Visit: Payer: Self-pay | Admitting: Family Medicine

## 2019-03-24 NOTE — Telephone Encounter (Signed)
Requested medication (s) are due for refill today: yes  Requested medication (s) are on the active medication list: yes  Last refill:  02/22/2019  Future visit scheduled: no  Notes to clinic:  refill cannot be delegated    Requested Prescriptions  Pending Prescriptions Disp Refills   ALPRAZolam (XANAX) 0.5 MG tablet [Pharmacy Med Name: ALPRAZolam 0.5 MG TABS 0.5 Tablet] 30 tablet 4    Sig: TAKE 1 TO 2 TABLETS BY MOUTH TWICE DAILY AS NEEDED FOR ANXIETY (PANIC ATTACKS)      Not Delegated - Psychiatry:  Anxiolytics/Hypnotics Failed - 03/24/2019  7:50 AM      Failed - This refill cannot be delegated      Failed - Urine Drug Screen completed in last 360 days.      Failed - Valid encounter within last 6 months    Recent Outpatient Visits           8 months ago Herpes zoster without complication   [facility] Birdie Sons, MD   1 year ago Hot flashes   [facility] Birdie Sons, MD   2 years ago Contact dermatitis due to other agent, unspecified contact dermatitis type   [facility] Birdie Sons, MD   2 years ago Strain of left shoulder, initial encounter   [facility] Birdie Sons, MD   3 years ago Panic attacks   [facility] Birdie Sons, MD

## 2019-04-27 ENCOUNTER — Ambulatory Visit
Admission: RE | Admit: 2019-04-27 | Discharge: 2019-04-27 | Disposition: A | Discharge status: Home / Self Care | Payer: No Typology Code available for payment source | Source: Ambulatory Visit | Attending: Obstetrics & Gynecology | Admitting: Obstetrics & Gynecology

## 2019-04-27 ENCOUNTER — Other Ambulatory Visit: Payer: Self-pay

## 2019-04-27 DIAGNOSIS — Z1231 Encounter for screening mammogram for malignant neoplasm of breast: Secondary | ICD-10-CM | POA: Diagnosis present

## 2019-07-24 ENCOUNTER — Other Ambulatory Visit: Payer: Self-pay | Admitting: Family Medicine

## 2019-07-24 DIAGNOSIS — F419 Anxiety disorder, unspecified: Secondary | ICD-10-CM

## 2019-07-24 NOTE — Telephone Encounter (Signed)
Requested medication (s) are due for refill today: yes  Requested medication (s) are on the active medication list: yes  Last refill:  06/20/19  Future visit scheduled: no  Notes to clinic:  not delegated    Requested Prescriptions  Pending Prescriptions Disp Refills   ALPRAZolam (XANAX) 0.5 MG tablet [Pharmacy Med Name: ALPRAZolam 0.5 MG TABS 0.5 Tablet] 30 tablet 3    Sig: TAKE 1 TO 2 TABLETS BY MOUTH TWICE DAILY AS NEEDED FOR ANXIETY (PANIC ATTACKS)      Not Delegated - Psychiatry:  Anxiolytics/Hypnotics Failed - 07/24/2019  7:24 AM      Failed - This refill cannot be delegated      Failed - Urine Drug Screen completed in last 360 days.      Failed - Valid encounter within last 6 months    Recent Outpatient Visits           1 year ago Herpes zoster without complication   [facility] Birdie Sons, MD   2 years ago Hot flashes   [facility] Birdie Sons, MD   2 years ago Contact dermatitis due to other agent, unspecified contact dermatitis type   [facility] Birdie Sons, MD   3 years ago Strain of left shoulder, initial encounter   [facility] Birdie Sons, MD   3 years ago Panic attacks   [facility] Birdie Sons, MD

## 2019-09-06 NOTE — Telephone Encounter (Signed)
Sch work in Architectural technologist

## 2019-09-07 ENCOUNTER — Encounter: Payer: Self-pay | Admitting: Obstetrics & Gynecology

## 2019-09-07 ENCOUNTER — Ambulatory Visit (INDEPENDENT_AMBULATORY_CARE_PROVIDER_SITE_OTHER): Payer: No Typology Code available for payment source | Admitting: Obstetrics & Gynecology

## 2019-09-07 ENCOUNTER — Other Ambulatory Visit: Payer: Self-pay

## 2019-09-07 VITALS — BP 110/70 | Ht 65.0 in | Wt 121.0 lb

## 2019-09-07 DIAGNOSIS — L539 Erythematous condition, unspecified: Secondary | ICD-10-CM | POA: Diagnosis not present

## 2019-09-07 MED ORDER — CEPHALEXIN 500 MG PO CAPS: 500.0000 mg | ORAL_CAPSULE | Freq: Four times a day (QID) | ORAL | 2 refills | Status: DC

## 2019-09-07 NOTE — Telephone Encounter (Signed)
Patient is scheduled for 09/07/19 at 10:20

## 2019-09-07 NOTE — Progress Notes (Signed)
  HPI:      Heidi Guerra is a 39 y.o. [id number] who is premenopausal, presents today for a problem visit.  She complains of breast tenderness and skin color change on the rightside which she first noticed two days ago.  It has not significantly changed.  Associated symptoms include redness in a small spot like area 2-3 cm, also pain to touch, ice made it worse, no mass.  Denies nipple discharge, trauma, recent infection, known bug bites.  No fever.  Prior Mammogram: 04/2019 normal. Pt had breast implant surgery 2 years ago Prior breast problems: No Family History: Breast Cancer-relatedfamily history is not on file.  PMHx: She  has a past medical history of History of chicken pox. Also,  has a past surgical history that includes Abdominal hysterectomy (2008); Tubal ligation (2007); Tonsillectomy and adenoidectomy (1999); Colposcopy; Cervical biopsy w/ loop electrode excision; Breast biopsy (Left, 04/17/2015); and Augmentation mammaplasty (Bilateral, 11/2017)., family history includes CAD in her father; Diabetes in her maternal grandfather and maternal grandmother; Heart attack in her father.,  reports that she has never smoked. She has never used smokeless tobacco. She reports current alcohol use. She reports that she does not use drugs.  She has a current medication list which includes the following prescription(s): alprazolam, biotin, calcium, multiple vitamin, omega-3 fatty acids, and cephalexin. Also, is allergic to singulair  [montelukast sodium] and etodolac.  Review of Systems  All other systems reviewed and are negative.   Objective: BP 110/70   Ht 5\' 5"  (1.651 m)   Wt 121 lb (54.9 kg)   BMI 20.14 kg/m  Physical Exam Constitutional:      General: She is not in acute distress.    Appearance: Normal appearance. She is well-developed.  Cardiovascular:     Rate and Rhythm: Normal rate and regular rhythm.     Pulses: Normal pulses.     Heart sounds: Normal heart sounds. No  murmur. No friction rub. No gallop.   Pulmonary:     Effort: Pulmonary effort is normal.     Breath sounds: Normal breath sounds.  Chest:     Chest wall: No mass, tenderness or edema.     Breasts:        Right: Skin change and tenderness present. No inverted nipple, mass or nipple discharge.        Left: No inverted nipple, mass, nipple discharge, skin change or tenderness.       Comments: Right breast area of erythema, T Musculoskeletal:        General: Normal range of motion.  Lymphadenopathy:     Upper Body:     Right upper body: No pectoral adenopathy.     Left upper body: No pectoral adenopathy.  Neurological:     Mental Status: She is alert and oriented to person, place, and time.  Skin:    General: Skin is warm and dry.     Findings: No abrasion, bruising, erythema, lesion or rash.  Psychiatric:        Speech: Speech normal.        Behavior: Behavior normal.        Judgment: Judgment normal.  Vitals reviewed.     ASSESSMENT/PLAN:   1. Breast erythema Cellulitis most likely etiology, from insect or trauma, low risk for implant leak or concern Plan Keflex ABX Re-assess one week if no improvements Consider [place] then  Barnett Applebaum, MD, [facility], [facility] 09/07/2019  11:20 AM

## 2019-10-20 ENCOUNTER — Ambulatory Visit: Payer: No Typology Code available for payment source | Admitting: Physician Assistant

## 2019-10-31 ENCOUNTER — Ambulatory Visit (INDEPENDENT_AMBULATORY_CARE_PROVIDER_SITE_OTHER): Payer: No Typology Code available for payment source | Admitting: Family Medicine

## 2019-10-31 ENCOUNTER — Other Ambulatory Visit: Payer: Self-pay | Admitting: Family Medicine

## 2019-10-31 ENCOUNTER — Other Ambulatory Visit: Payer: Self-pay

## 2019-10-31 ENCOUNTER — Encounter: Payer: Self-pay | Admitting: Family Medicine

## 2019-10-31 DIAGNOSIS — R238 Other skin changes: Secondary | ICD-10-CM

## 2019-10-31 MED ORDER — VALACYCLOVIR HCL 1 G PO TABS: 1000.0000 mg | ORAL_TABLET | Freq: Three times a day (TID) | ORAL | 5 refills | Status: DC

## 2019-10-31 NOTE — Progress Notes (Signed)
     Established patient visit   Patient: Heidi Guerra   DOB: 1981-03-22   39 y.o. Female  MRN: [id number] Visit Date: 10/31/2019  Today's healthcare provider: Lelon Huh, MD   Chief Complaint  Patient presents with  . Rash  . Herpes Zoster   Subjective    HPI   Pt is coming in to discuss treatment options for recurrent shingles.  She states she has had three outbreaks in the last year, all in the same area of right buttocks and improved with valacyclovir.   Most recent outbreak of 10/19/2019 pt was prescribed Valtrex and has almost completely resolved.  First episode was thought to be zoster and she is interested in getting zoster vaccine.      Medications: Outpatient Medications Prior to Visit  Medication Sig  . ALPRAZolam (XANAX) 0.5 MG tablet TAKE 1 TO 2 TABLETS BY MOUTH TWICE DAILY AS NEEDED FOR ANXIETY (PANIC ATTACKS)  . Biotin 5 MG CAPS Take by mouth.  Marland Kitchen CALCIUM PO Take by mouth.  . Multiple Vitamins-Minerals (MULTIVITAMIN ADULT PO) Take 1 tablet by mouth daily.  . cephALEXin (KEFLEX) 500 MG capsule Take 1 capsule (500 mg total) by mouth 4 (four) times daily.  . Omega-3 Fatty Acids (FISH OIL PO) Take by mouth.   No facility-administered medications prior to visit.    Review of Systems  Constitutional: Negative.      Objective    BP 110/74 (BP Location: Right Arm, Patient Position: Sitting, Cuff Size: Normal)   Pulse 94   Temp (!) 97.5 F (36.4 C) (Temporal)   Wt 124 lb (56.2 kg)   BMI 20.63 kg/m    Physical Exam  She shows photo of blister area she took of right buttocks similar, but milder than lesion that first appeared last year.   No results found for any visits on 10/31/19.  Assessment & Plan     1. 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  Refill - valACYclovir (VALTREX) 1000 MG tablet; Take 1 tablet (1,000 mg total) by mouth every 8 (eight) hours for 7 days.  Dispense: 21  tablet; Refill: 5  Advised that shingles vaccine would likely not be covered by insurance and may not be effective. Advised we could perform viral culture of lesion with next outbreak, but valacyclovir may interfere with culture. Would also consider daily valacyclovir for prophylaxis.        The entirety of the information documented in the History of Present Illness, Review of Systems and Physical Exam were personally obtained by me. Portions of this information were initially documented by the CMA and reviewed by me for thoroughness and accuracy.      Lelon Huh, MD  [facility] [phone] (phone) [phone] (fax)  [place]

## 2019-11-28 ENCOUNTER — Other Ambulatory Visit: Payer: Self-pay

## 2019-11-28 ENCOUNTER — Other Ambulatory Visit: Payer: No Typology Code available for payment source

## 2019-11-28 DIAGNOSIS — Z20822 Contact with and (suspected) exposure to covid-19: Secondary | ICD-10-CM

## 2019-11-30 LAB — SARS-COV-2, NAA 2 DAY TAT

## 2019-11-30 LAB — NOVEL CORONAVIRUS, NAA: SARS-CoV-2, NAA: DETECTED — AB

## 2020-01-18 ENCOUNTER — Other Ambulatory Visit: Payer: Self-pay | Admitting: Family Medicine

## 2020-01-18 DIAGNOSIS — F419 Anxiety disorder, unspecified: Secondary | ICD-10-CM

## 2020-01-18 NOTE — Telephone Encounter (Signed)
Requested medication (s) are due for refill today:yes  Requested medication (s) are on the active medication list: yes  Last refill:  07/24/19  #30  3 refills  Future visit scheduled: No  Notes to clinic:  Not delegated    Requested Prescriptions  Pending Prescriptions Disp Refills   ALPRAZolam (XANAX) 0.5 MG tablet [Pharmacy Med Name: ALPRAZolam 0.5 MG TABS 0.5 Tablet] 30 tablet 3    Sig: TAKE 1 TO 2 TABLETS BY MOUTH TWICE DAILY AS NEEDED FOR ANXIETY (PANIC ATTACKS)      Not Delegated - Psychiatry:  Anxiolytics/Hypnotics Failed - 01/18/2020 11:49 AM      Failed - This refill cannot be delegated      Failed - Urine Drug Screen completed in last 360 days.      Passed - Valid encounter within last 6 months    Recent Outpatient Visits           2 months ago Vesicular skin lesions   [facility] Birdie Sons, MD   1 year ago Herpes zoster without complication   [facility] Birdie Sons, MD   2 years ago Hot flashes   [facility] Birdie Sons, MD   3 years ago Contact dermatitis due to other agent, unspecified contact dermatitis type   [facility] Birdie Sons, MD   3 years ago Strain of left shoulder, initial encounter   [facility] Birdie Sons, MD

## 2020-02-26 ENCOUNTER — Ambulatory Visit (INDEPENDENT_AMBULATORY_CARE_PROVIDER_SITE_OTHER): Payer: No Typology Code available for payment source | Admitting: Obstetrics & Gynecology

## 2020-02-26 ENCOUNTER — Other Ambulatory Visit: Payer: Self-pay

## 2020-02-26 ENCOUNTER — Encounter: Payer: Self-pay | Admitting: Obstetrics & Gynecology

## 2020-02-26 VITALS — BP 100/60 | Ht 65.0 in | Wt 121.0 lb

## 2020-02-26 DIAGNOSIS — Z1231 Encounter for screening mammogram for malignant neoplasm of breast: Secondary | ICD-10-CM

## 2020-02-26 DIAGNOSIS — Z1321 Encounter for screening for nutritional disorder: Secondary | ICD-10-CM

## 2020-02-26 DIAGNOSIS — Z131 Encounter for screening for diabetes mellitus: Secondary | ICD-10-CM | POA: Diagnosis not present

## 2020-02-26 DIAGNOSIS — Z01419 Encounter for gynecological examination (general) (routine) without abnormal findings: Secondary | ICD-10-CM | POA: Diagnosis not present

## 2020-02-26 DIAGNOSIS — R5383 Other fatigue: Secondary | ICD-10-CM

## 2020-02-26 DIAGNOSIS — Z13220 Encounter for screening for lipoid disorders: Secondary | ICD-10-CM

## 2020-02-26 NOTE — Progress Notes (Signed)
HPI:      Heidi Guerra is a 39 y.o. [phone] who LMP was No LMP recorded. Patient has had a hysterectomy., she presents today for her annual examination. The patient has no complaints today. The patient is sexually active. Her last pap: approximate date 2019 and was normal and last mammogram: approximate date 2021 (Jan) and was normal. The patient does perform self breast exams.  There is no notable family history of breast or ovarian cancer in her family.  The patient has regular exercise: yes.  The patient denies current symptoms of depression.    GYN History: Contraception: status post hysterectomy  PMHx: Past Medical History:  Diagnosis Date  . History of chicken pox    Past Surgical History:  Procedure Laterality Date  . ABDOMINAL HYSTERECTOMY  2008   for excessive bleeding  . AUGMENTATION MAMMAPLASTY Bilateral 11/2017  . 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 CERVICAL BIOPSY  W/ LOOP ELECTRODE EXCISION    . COLPOSCOPY    . TONSILLECTOMY AND ADENOIDECTOMY  1999  . TUBAL LIGATION  2007   Family History  Problem Relation Age of Onset  . CAD Father   . Heart attack Father        died age 75, waw smoker  . Diabetes Maternal Grandmother   . Diabetes Maternal Grandfather    Social History   Tobacco Use  . Smoking status: Never Smoker  . Smokeless tobacco: Never Used  Vaping Use  . Vaping Use: Never assessed  Substance Use Topics  . Alcohol use: Yes    Alcohol/week: 0.0 standard drinks    Comment: occasional use  . Drug use: No    Current Outpatient Medications:  .  ALPRAZolam (XANAX) 0.5 MG tablet, TAKE 1 TO 2 TABLETS BY MOUTH TWICE DAILY AS NEEDED FOR ANXIETY (PANIC ATTACKS), Disp: 30 tablet, Rfl: 3 .  Biotin 5 MG CAPS, Take by mouth., Disp: , Rfl:  .  Multiple Vitamins-Minerals (MULTIVITAMIN ADULT PO), Take 1 tablet by mouth daily., Disp: , Rfl:  Allergies: Singulair  [montelukast sodium] and Etodolac  Review of Systems  Constitutional:  Positive for malaise/fatigue. Negative for chills and fever.  HENT: Negative for congestion, sinus pain and sore throat.   Eyes: Negative for blurred vision and pain.  Respiratory: Negative for cough and wheezing.   Cardiovascular: Negative for chest pain and leg swelling.  Gastrointestinal: Negative for abdominal pain, constipation, diarrhea, heartburn, nausea and vomiting.  Genitourinary: Negative for dysuria, frequency, hematuria and urgency.  Musculoskeletal: Negative for back pain, joint pain, myalgias and neck pain.  Skin: Negative for itching and rash.  Neurological: Negative for dizziness, tremors and weakness.  Endo/Heme/Allergies: Does not bruise/bleed easily.  Psychiatric/Behavioral: Negative for depression. The patient is not nervous/anxious and does not have insomnia.     Objective: BP 100/60   Ht 5\' 5"  (1.651 m)   Wt 121 lb (54.9 kg)   BMI 20.14 kg/m   Filed Weights   02/26/20 0835  Weight: 121 lb (54.9 kg)   Body mass index is 20.14 kg/m. Physical Exam Constitutional:      General: She is not in acute distress.    Appearance: She is well-developed.  Genitourinary:     Pelvic exam was performed with patient supine.     Vulva, urethra, bladder, vagina and rectum normal.     No lesions in the vagina.     No vaginal bleeding.     Cervix is absent.  Uterus is absent.     No right or left adnexal mass present.     Right adnexa not tender.     Left adnexa not tender.     Genitourinary Comments: Vaginal cuff well healed  HENT:     Head: Normocephalic and atraumatic. No laceration.     Right Ear: Hearing normal.     Left Ear: Hearing normal.     Mouth/Throat:     Pharynx: Uvula midline.  Eyes:     Pupils: Pupils are equal, round, and reactive to light.  Neck:     Thyroid: No thyromegaly.  Cardiovascular:     Rate and Rhythm: Normal rate and regular rhythm.     Heart sounds: No murmur heard.  No friction rub. No gallop.   Pulmonary:     Effort: Pulmonary  effort is normal. No respiratory distress.     Breath sounds: Normal breath sounds. No wheezing.  Chest:     Breasts:        Right: No mass, skin change or tenderness.        Left: No mass, skin change or tenderness.  Abdominal:     General: Bowel sounds are normal. There is no distension.     Palpations: Abdomen is soft.     Tenderness: There is no abdominal tenderness. There is no rebound.  Musculoskeletal:        General: Normal range of motion.     Cervical back: Normal range of motion and neck supple.  Neurological:     Mental Status: She is alert and oriented to person, place, and time.     Cranial Nerves: No cranial nerve deficit.  Skin:    General: Skin is warm and dry.  Psychiatric:        Judgment: Judgment normal.  Vitals reviewed.     Assessment:  ANNUAL EXAM 1. Women's annual routine gynecological examination   2. Encounter for screening mammogram for malignant neoplasm of breast   3. Fatigue, unspecified type   4. Screening for diabetes mellitus   5. Screening for cholesterol level   6. Encounter for vitamin deficiency screening     Screening Plan:            1.  Cervical Screening-  Pap smear schedule reviewed with patient  2. Breast screening- Exam annually and mammogram>40 planned   3. Colonoscopy every 10 years, Hemoccult testing - after age 28  4. Labs Ordered today   5. Fatigue, unspecified type - CBC With Differential - TSH    F/U  Return in about 1 year (around 02/25/2021) for Annual.  Barnett Applebaum, MD, [facility], [facility] 02/26/2020  9:05 AM

## 2020-02-26 NOTE — Patient Instructions (Addendum)
PAP every 5 years Mammogram every year    Call [phone] to schedule at [facility] today  Thank you for choosing [facility]. As part of our ongoing efforts to improve patient experience, we would appreciate your feedback. Please fill out the short survey that you will receive by mail or MyChart. Your opinion is important to [place]! - Dr. Kenton Kingfisher

## 2020-02-27 LAB — CBC WITH DIFFERENTIAL
Basophils Absolute: 0.1 x10E3/uL (ref 0.0–0.2)
Basos: 1 %
EOS (ABSOLUTE): 0.4 x10E3/uL (ref 0.0–0.4)
Eos: 5 %
Hematocrit: 42.9 % (ref 34.0–46.6)
Hemoglobin: 14.6 g/dL (ref 11.1–15.9)
Immature Grans (Abs): 0 x10E3/uL (ref 0.0–0.1)
Immature Granulocytes: 0 %
Lymphocytes Absolute: 2 x10E3/uL (ref 0.7–3.1)
Lymphs: 31 %
MCH: 32.2 pg (ref 26.6–33.0)
MCHC: 34 g/dL (ref 31.5–35.7)
MCV: 95 fL (ref 79–97)
Monocytes Absolute: 0.5 x10E3/uL (ref 0.1–0.9)
Monocytes: 8 %
Neutrophils Absolute: 3.5 x10E3/uL (ref 1.4–7.0)
Neutrophils: 55 %
RBC: 4.54 x10E6/uL (ref 3.77–5.28)
RDW: 12.3 % (ref 11.7–15.4)
WBC: 6.5 x10E3/uL (ref 3.4–10.8)

## 2020-02-27 LAB — LIPID PANEL
Chol/HDL Ratio: 2.7 ratio (ref 0.0–4.4)
Cholesterol, Total: 217 mg/dL — ABNORMAL HIGH (ref 100–199)
HDL: 79 mg/dL (ref >39)
LDL Chol Calc (NIH): 126 mg/dL — ABNORMAL HIGH (ref 0–99)
Triglycerides: 70 mg/dL (ref 0–149)
VLDL Cholesterol Cal: 12 mg/dL (ref 5–40)

## 2020-02-27 LAB — VITAMIN D 25 HYDROXY (VIT D DEFICIENCY, FRACTURES): Vit D, 25-Hydroxy: 30 ng/mL (ref 30.0–100.0)

## 2020-02-27 LAB — TSH: TSH: 2.56 u[IU]/mL (ref 0.450–4.500)

## 2020-02-27 LAB — GLUCOSE, FASTING: Glucose, Plasma: 73 mg/dL (ref 65–99)

## 2020-03-19 ENCOUNTER — Encounter: Payer: Self-pay | Admitting: Family Medicine

## 2020-04-29 ENCOUNTER — Ambulatory Visit
Admission: RE | Admit: 2020-04-29 | Discharge: 2020-04-29 | Disposition: A | Discharge status: Home / Self Care | Payer: No Typology Code available for payment source | Source: Ambulatory Visit | Attending: Obstetrics & Gynecology | Admitting: Obstetrics & Gynecology

## 2020-04-29 ENCOUNTER — Other Ambulatory Visit: Payer: Self-pay

## 2020-04-29 DIAGNOSIS — Z1231 Encounter for screening mammogram for malignant neoplasm of breast: Secondary | ICD-10-CM | POA: Diagnosis present

## 2020-04-30 ENCOUNTER — Other Ambulatory Visit: Payer: Self-pay | Admitting: Internal Medicine

## 2020-04-30 ENCOUNTER — Ambulatory Visit: Payer: No Typology Code available for payment source | Attending: Internal Medicine

## 2020-04-30 DIAGNOSIS — Z23 Encounter for immunization: Secondary | ICD-10-CM

## 2020-04-30 NOTE — Progress Notes (Signed)
   Covid-19 Vaccination Clinic  Name:  Heidi Guerra    MRN: [id number] DOB: April 10, 1980  04/30/2020  Heidi Guerra was observed post Covid-19 immunization for 15 minutes without incident. She was provided with Vaccine Information Sheet and instruction to access the V-Safe system.   Heidi Guerra was instructed to call 911 with any severe reactions post vaccine: Marland Kitchen Difficulty breathing  . Swelling of face and throat  . A fast heartbeat  . A bad rash all over body  . Dizziness and weakness   Immunizations Administered    Name Date Dose VIS Date Route   Pfizer COVID-19 Vaccine 04/30/2020  2:32 PM 0.3 mL 01/24/2020 Intramuscular   Manufacturer: [place]   Lot: [id number]   [place]: [address]-2

## 2020-06-07 ENCOUNTER — Other Ambulatory Visit: Payer: Self-pay

## 2020-06-07 ENCOUNTER — Encounter: Payer: Self-pay | Admitting: Family Medicine

## 2020-06-07 ENCOUNTER — Ambulatory Visit (INDEPENDENT_AMBULATORY_CARE_PROVIDER_SITE_OTHER): Payer: No Typology Code available for payment source | Admitting: Family Medicine

## 2020-06-07 ENCOUNTER — Other Ambulatory Visit: Payer: Self-pay | Admitting: Family Medicine

## 2020-06-07 VITALS — BP 117/74 | HR 101 | Temp 98.1°F | Resp 16 | Wt 121.0 lb

## 2020-06-07 DIAGNOSIS — Z23 Encounter for immunization: Secondary | ICD-10-CM

## 2020-06-07 DIAGNOSIS — F419 Anxiety disorder, unspecified: Secondary | ICD-10-CM | POA: Diagnosis not present

## 2020-06-07 DIAGNOSIS — F5104 Psychophysiologic insomnia: Secondary | ICD-10-CM | POA: Diagnosis not present

## 2020-06-07 MED ORDER — TEMAZEPAM 7.5 MG PO CAPS: 7.5000 mg | ORAL_CAPSULE | Freq: Every evening | ORAL | 1 refills | Status: DC | PRN

## 2020-06-07 MED ORDER — ALPRAZOLAM 0.5 MG PO TABS: ORAL_TABLET | ORAL | 3 refills | Status: DC

## 2020-06-07 NOTE — Progress Notes (Signed)
Established patient visit   Patient: Heidi Guerra   DOB: 10-25-1980   40 y.o. Female  MRN: [id number] Visit Date: 06/07/2020  Today's healthcare provider: Lelon Huh, MD   Chief Complaint  Patient presents with  . Insomnia   Subjective    HPI  Insomnia: Patient complain of having trouble falling asleep. She states this has been an ongoing problem for several years. She also complains of trouble concentrating and remembering things. Concentration issues started after getting her COVID booster shot in January 2022. Associated symptoms also includes fatigue and irritability. She has been taking 1/2 tablet of Alprazolam to help with sleeping. She states she has been taking it every night and it stops her racing thoughts so she can get to sleep, but wakes again after 3-4 hours with racing thoughts and is often unable to get back to sleep. She also occasionally take 1/2 alprazolam during the day when she feels very anxious or like a panic attack is coming on, but his is always work related and never needs to take it on her days off.      Medications: Outpatient Medications Prior to Visit  Medication Sig  . Multiple Vitamins-Minerals (MULTIVITAMIN ADULT PO) Take 1 tablet by mouth daily.  . [DISCONTINUED] ALPRAZolam (XANAX) 0.5 MG tablet TAKE 1 TO 2 TABLETS BY MOUTH TWICE DAILY AS NEEDED FOR ANXIETY (PANIC ATTACKS)  . [DISCONTINUED] Biotin 5 MG CAPS Take by mouth.   No facility-administered medications prior to visit.    Review of Systems  Constitutional: Positive for fatigue. Negative for appetite change, chills and fever.  Respiratory: Negative for chest tightness and shortness of breath.   Cardiovascular: Positive for palpitations. Negative for chest pain.  Gastrointestinal: Negative for abdominal pain, nausea and vomiting.  Neurological: Negative for dizziness and weakness.  Psychiatric/Behavioral: Positive for agitation, decreased concentration and sleep disturbance  (trouble falling asleep). Negative for confusion. The patient is nervous/anxious.        Objective    BP 117/74 (BP Location: Left Arm, Patient Position: Sitting, Cuff Size: Normal)   Pulse (!) 101   Temp 98.1 F (36.7 C) (Temporal)   Resp 16   Wt 121 lb (54.9 kg)   BMI 20.14 kg/m     Physical Exam  General appearance: Well developed, well nourished female, cooperative and in no acute distress Head: Normocephalic, without obvious abnormality, atraumatic Respiratory: Respirations even and unlabored, normal respiratory rate Extremities: All extremities are intact.  Skin: Skin color, texture, turgor normal. No rashes seen  Psych: Appropriate mood and affect. Neurologic: Mental status: Alert, oriented to person, place, and time, thought content appropriate.     Assessment & Plan     1. Psychophysiological insomnia Xanax works well to get her to sleep, but only lasts a few hours will change to - temazepam (RESTORIL) 7.5 MG capsule; Take 1 capsule (7.5 mg total) by mouth at bedtime as needed for sleep.  Dispense: 30 capsule; Refill: 1  2. Anxiety Takes occasional alprazolam during the day for anxiety and panic attacks related to stressful work environment. refill- ALPRAZolam (XANAX) 0.5 MG tablet; TAKE 1 TO 2 TABLETS BY MOUTH TWICE DAILY AS NEEDED FOR ANXIETY (PANIC ATTACKS)  Dispense: 30 tablet; Refill: 3  3. Need for tetanus, diphtheria, and acellular pertussis (Tdap) vaccine in patient of adolescent age or older  - Administer Tetanus-diphtheria-acellular pertussis (Tdap) vaccine        The entirety of the information documented in the History of Present Illness,  Review of Systems and Physical Exam were personally obtained by me. Portions of this information were initially documented by the CMA and reviewed by me for thoroughness and accuracy.      Lelon Huh, MD  [facility] [phone] (phone) [phone] (fax)  [place]

## 2020-06-07 NOTE — Patient Instructions (Addendum)
Please review the attached list of medications and notify my office if there are any errors.    

## 2020-08-07 ENCOUNTER — Other Ambulatory Visit: Payer: Self-pay

## 2020-08-07 MED FILL — Temazepam Cap 7.5 MG: ORAL | 30 days supply | Qty: 30 | Fill #0 | Status: CN

## 2020-08-07 MED FILL — Alprazolam Tab 0.5 MG: ORAL | 7 days supply | Qty: 30 | Fill #0 | Status: AC

## 2020-08-08 ENCOUNTER — Other Ambulatory Visit: Payer: Self-pay

## 2020-08-09 ENCOUNTER — Other Ambulatory Visit: Payer: Self-pay

## 2020-08-13 ENCOUNTER — Other Ambulatory Visit: Payer: Self-pay

## 2020-08-13 MED FILL — Temazepam Cap 7.5 MG: ORAL | 30 days supply | Qty: 30 | Fill #0 | Status: AC

## 2020-09-25 MED FILL — Alprazolam Tab 0.5 MG: ORAL | 7 days supply | Qty: 30 | Fill #1 | Status: AC

## 2020-09-26 ENCOUNTER — Other Ambulatory Visit: Payer: Self-pay

## 2020-10-30 ENCOUNTER — Other Ambulatory Visit: Payer: Self-pay

## 2020-10-30 MED ORDER — AZITHROMYCIN 500 MG PO TABS
ORAL_TABLET | ORAL | 0 refills | Status: DC
  Filled 2020-10-30: qty 2, 1d supply, fill #0

## 2020-10-30 MED ORDER — BISMUTH SUBSALICYLATE 262 MG PO CHEW: CHEWABLE_TABLET | ORAL | 0 refills | Status: DC

## 2020-11-05 MED FILL — Alprazolam Tab 0.5 MG: ORAL | 7 days supply | Qty: 30 | Fill #2 | Status: AC

## 2020-11-06 ENCOUNTER — Other Ambulatory Visit: Payer: Self-pay

## 2020-12-16 ENCOUNTER — Other Ambulatory Visit: Payer: Self-pay | Admitting: Family Medicine

## 2020-12-16 DIAGNOSIS — F419 Anxiety disorder, unspecified: Secondary | ICD-10-CM

## 2020-12-17 ENCOUNTER — Other Ambulatory Visit: Payer: Self-pay

## 2020-12-17 MED ORDER — ALPRAZOLAM 0.5 MG PO TABS
ORAL_TABLET | ORAL | 3 refills | Status: AC
  Filled 2020-12-17: qty 30, 7d supply, fill #0
  Filled 2021-01-20: qty 30, 7d supply, fill #1
  Filled 2021-04-28: qty 30, 7d supply, fill #2

## 2020-12-17 NOTE — Telephone Encounter (Signed)
Requested medications are due for refill today.  unknown  Requested medications are on the active medications list.  no  Last refill. 11/07/2020  Future visit scheduled.   no  Notes to clinic.  Prescription ended 12/04/2020.

## 2020-12-23 ENCOUNTER — Other Ambulatory Visit: Payer: Self-pay | Admitting: Family Medicine

## 2020-12-23 ENCOUNTER — Other Ambulatory Visit: Payer: Self-pay

## 2020-12-23 DIAGNOSIS — R238 Other skin changes: Secondary | ICD-10-CM

## 2020-12-23 MED ORDER — VALACYCLOVIR HCL 1 G PO TABS
ORAL_TABLET | ORAL | 5 refills | Status: DC
  Filled 2020-12-23: qty 21, 7d supply, fill #0

## 2021-01-20 ENCOUNTER — Other Ambulatory Visit: Payer: Self-pay

## 2021-02-05 ENCOUNTER — Encounter: Payer: Self-pay | Admitting: Family Medicine

## 2021-02-05 ENCOUNTER — Other Ambulatory Visit: Payer: Self-pay

## 2021-02-05 ENCOUNTER — Ambulatory Visit (INDEPENDENT_AMBULATORY_CARE_PROVIDER_SITE_OTHER): Payer: No Typology Code available for payment source | Admitting: Family Medicine

## 2021-02-05 VITALS — BP 113/84 | HR 86 | Resp 16 | Wt 116.4 lb

## 2021-02-05 DIAGNOSIS — M25511 Pain in right shoulder: Secondary | ICD-10-CM | POA: Diagnosis not present

## 2021-02-05 DIAGNOSIS — F439 Reaction to severe stress, unspecified: Secondary | ICD-10-CM | POA: Diagnosis not present

## 2021-02-05 DIAGNOSIS — F5104 Psychophysiologic insomnia: Secondary | ICD-10-CM

## 2021-02-05 DIAGNOSIS — G5681 Other specified mononeuropathies of right upper limb: Secondary | ICD-10-CM

## 2021-02-05 MED ORDER — MELOXICAM 7.5 MG PO TABS
7.5000 mg | ORAL_TABLET | Freq: Every day | ORAL | 0 refills | Status: DC
  Filled 2021-02-05: qty 60, 60d supply, fill #0

## 2021-02-05 MED ORDER — MIRTAZAPINE 15 MG PO TABS
15.0000 mg | ORAL_TABLET | Freq: Every day | ORAL | 0 refills | Status: DC
  Filled 2021-02-05: qty 90, 90d supply, fill #0

## 2021-02-05 MED ORDER — BACLOFEN 10 MG PO TABS
10.0000 mg | ORAL_TABLET | Freq: Three times a day (TID) | ORAL | 0 refills | Status: DC
  Filled 2021-02-05: qty 30, 10d supply, fill #0

## 2021-02-05 NOTE — Assessment & Plan Note (Signed)
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

## 2021-02-05 NOTE — Assessment & Plan Note (Signed)
Pinpoint location in R supraspinatus Trial of mobic and baclofen Suggest continue heat, massage, stretching

## 2021-02-05 NOTE — Progress Notes (Signed)
Established patient visit   Patient: Heidi Guerra   DOB: [birth year]   40 y.o. Female  MRN: [id number] Visit Date: 02/05/2021  Today's healthcare provider: Gwyneth Sprout, FNP   Chief Complaint  Patient presents with   Insomnia   Anxiety   Neck Pain   Subjective    Neck Pain  This is a new problem. The current episode started 1 to 4 weeks ago. The problem occurs constantly. The problem has been gradually worsening. The pain is present in the midline (radiating to right shoulder). The quality of the pain is described as aching. The pain is moderate. Stiffness is present In the morning. Pertinent negatives include no chest pain, fever, headaches, leg pain, numbness, pain with swallowing, paresis, photophobia, syncope, tingling, trouble swallowing, visual change, weakness or weight loss. She has tried neck support, bed rest and heat for the symptoms. The treatment provided no relief.   Follow up for Insomnia  The patient was last seen for this 8 months ago. Changes made at last visit include switched from Xanax to Restoril 7.5mg .  She reports poor compliance with treatment. She feels that condition is Worse. She is not having side effects.  Patient reports that medication did not resolve issue and states that sleep habits are still poor  -----------------------------------------------------------------------------------------  Anxiety, Follow-up  She was last seen for anxiety 8 months ago. Changes made at last visit include continue Xanax 0.5mg  BID PRN.   She reports excellent compliance with treatment. She reports excellent tolerance of treatment. She is not having side effects.   She feels her anxiety is moderate and Unchanged since last visit.  Symptoms: No chest pain No difficulty concentrating  No dizziness Yes fatigue  No feelings of losing control Yes insomnia  No irritable Yes palpitations  Yes panic attacks Yes racing thoughts  No shortness of breath Yes  sweating  No tremors/shakes    GAD-7 Results No flowsheet data found.  PHQ-9 Scores PHQ9 SCORE ONLY 06/07/2020  PHQ-9 Total Score 10    ---------------------------------------------------------------------------------------------------     Medications: Outpatient Medications Prior to Visit  Medication Sig   ALPRAZolam (XANAX) 0.5 MG tablet TAKE 1 TO 2 TABLETS BY MOUTH TWICE DAILY AS NEEDED FOR ANXIETY (PANIC ATTACKS)   Multiple Vitamins-Minerals (MULTIVITAMIN ADULT PO) Take 1 tablet by mouth daily.   [DISCONTINUED] azithromycin (ZITHROMAX) 500 MG tablet take 2 tabs   [DISCONTINUED] bismuth subsalicylate ([facility] BISMUTH) 262 MG chewable tablet 2 tab q 8 hrs for 3 days   [DISCONTINUED] COVID-19 mRNA vaccine, Pfizer, 30 MCG/0.3ML injection USE AS DIRECTED   [DISCONTINUED] temazepam (RESTORIL) 7.5 MG capsule TAKE 1 CAPSULE BY MOUTH AT BEDTIME AS NEEDED FOR SLEEP.   [DISCONTINUED] valACYclovir (VALTREX) 1000 MG tablet TAKE 1 TABLET (1,000 MG TOTAL) BY MOUTH EVERY 8 (EIGHT) HOURS FOR 7 DAYS.   No facility-administered medications prior to visit.    Review of Systems  Constitutional:  Negative for fever and weight loss.  HENT:  Negative for trouble swallowing.   Eyes:  Negative for photophobia.  Cardiovascular:  Negative for chest pain and syncope.  Musculoskeletal:  Positive for neck pain.  Neurological:  Negative for tingling, weakness, numbness and headaches.      Objective    BP 113/84   Pulse 86   Resp 16   Wt 116 lb 6.4 oz (52.8 kg)   SpO2 99%   BMI 19.37 kg/m    Physical Exam Vitals and nursing note reviewed.  Constitutional:  General: She is not in acute distress.    Appearance: Normal appearance. She is normal weight. She is not ill-appearing, toxic-appearing or diaphoretic.  HENT:     Head: Normocephalic and atraumatic.  Cardiovascular:     Rate and Rhythm: Normal rate and regular rhythm.     Pulses: Normal pulses.     Heart sounds: Normal heart sounds. No  murmur heard.   No friction rub. No gallop.  Pulmonary:     Effort: Pulmonary effort is normal. No respiratory distress.     Breath sounds: Normal breath sounds. No stridor. No wheezing, rhonchi or rales.  Chest:     Chest wall: No tenderness.  Abdominal:     General: Bowel sounds are normal.     Palpations: Abdomen is soft.  Musculoskeletal:        General: Tenderness present. No swelling, deformity or signs of injury. Normal range of motion.     Right lower leg: No edema.     Left lower leg: No edema.  Skin:    General: Skin is warm and dry.     Capillary Refill: Capillary refill takes less than 2 seconds.     Coloration: Skin is not jaundiced or pale.     Findings: Lesion present. No bruising, erythema or rash.          Comments: R upper shoulder- healing skin bx from skin cancer removal X  supraspinatus tenderness, small catch in shoulder from 105-120; as well as adduction across body Plan to refer to sports med if not relieved with anti inflammatories and muscle relaxants  Neurological:     General: No focal deficit present.     Mental Status: She is alert and oriented to person, place, and time. Mental status is at baseline.     Cranial Nerves: No cranial nerve deficit.     Sensory: No sensory deficit.     Motor: No weakness.     Coordination: Coordination normal.  Psychiatric:        Mood and Affect: Mood normal.        Behavior: Behavior normal.        Thought Content: Thought content normal.        Judgment: Judgment normal.     No results found for any visits on 02/05/21.  Assessment & Plan     Problem List Items Addressed This Visit       Nervous and Auditory   Pinched nerve in shoulder, right    Pinpoint location in R supraspinatus Trial of mobic and baclofen Suggest continue heat, massage, stretching       Relevant Medications   mirtazapine (REMERON) 15 MG tablet   meloxicam (MOBIC) 7.5 MG tablet   baclofen (LIORESAL) 10 MG tablet     Other    Insomnia    Chronic, unstable Appears to be anxiety related Has tried paxil and effexor previously Was taking benzo with good relief for insomnia Failed wean off Taking benzo for panic attacks only- limited at this time Trial of new Rx provided today      Relevant Medications   mirtazapine (REMERON) 15 MG tablet   Situational stress    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      Vonna Kotyk, FNP, have reviewed all documentation for this visit. The documentation on 02/05/21 for the exam, diagnosis, procedures, and orders are all accurate and complete.    Gwyneth Sprout, [place] [phone] (phone) [phone] (fax)  [place]

## 2021-02-05 NOTE — Assessment & Plan Note (Signed)
Chronic, unstable Appears to be anxiety related Has tried paxil and effexor previously Was taking benzo with good relief for insomnia Failed wean off Taking benzo for panic attacks only- limited at this time Trial of new Rx provided today

## 2021-03-03 NOTE — Progress Notes (Signed)
PCP:  Birdie Sons, MD   Chief Complaint  Patient presents with   Gynecologic Exam   Urinary Tract Infection    Feels the need to urinate again right after urinating x 2 days     HPI:      [name] is a 40 y.o. [id number] whose LMP was No LMP recorded. Patient has had a hysterectomy., presents today for her annual examination.  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 No PMB, no pelvic pain.  Sex activity: single partner, contraception - status post hysterectomy. No pain/bleeding. Last Pap: 01/10/15 Results were: no abnormalities /neg HPV DNA  Hx of STDs: HPV with LEEP  Last mammogram: 04/29/20 Results were: normal--routine follow-up in 12 months There is no FH of breast cancer. There is no FH of ovarian cancer. The patient does do self-breast exams. Hx of LT breast bx with fibroadenoma 2017  Tobacco use: The patient denies current or previous tobacco use. Alcohol use: none No drug use.  Exercise: not active  She does get adequate calcium and Vitamin D in her diet.  Pt with urinary urgency/frequency, pelvic discomfort and tingle after voiding for 2 days without hematuria, LBP, fevers. Hx of UTI in distant past.   Hx of borderline lipids 11/21, normal DM and TSH screening 11/21  Patient Active Problem List   Diagnosis Date Noted   Acute cystitis with hematuria 03/04/2021   Situational stress 02/05/2021   Acute pain of right shoulder 02/05/2021   Pinched nerve in shoulder, right 02/05/2021   Insomnia 05/02/2015    Past Surgical History:  Procedure Laterality Date   ABDOMINAL HYSTERECTOMY  2008   for excessive bleeding   AUGMENTATION MAMMAPLASTY Bilateral 11/2017   BREAST BIOPSY Left 04/17/2015   FIBROADENOMA   CERVICAL BIOPSY  W/ LOOP ELECTRODE EXCISION     COLPOSCOPY     TONSILLECTOMY AND ADENOIDECTOMY  1999   TUBAL LIGATION  2007    Family History  Problem Relation Age of Onset   CAD Father    Heart attack Father         died age 6, waw smoker   Diabetes Maternal Grandmother    Diabetes Maternal Grandfather    Scientist, research (medical) Parkinson White syndrome Daughter     Social History   Socioeconomic History   Marital status: Married    Spouse name: Not on file   Number of children: 2   Years of education: Not on file   Highest education level: Not on file  Occupational History   Not on file  Tobacco Use   Smoking status: Never   Smokeless tobacco: Never  Vaping Use   Vaping Use: Not on file  Substance and Sexual Activity   Alcohol use: Yes    Alcohol/week: 0.0 standard drinks    Comment: occasional use   Drug use: No   Sexual activity: Yes    Birth control/protection: Surgical    Comment: Hysterectomy  Other Topics Concern   Not on file  Social History Narrative   Not on file   Social Determinants of Health   Financial Resource Strain: Not on file  Food Insecurity: Not on file  Transportation Needs: Not on file  Physical Activity: Not on file  Stress: Not on file  Social Connections: Not on file  Intimate Partner Violence: Not on file     Current Outpatient Medications:    mirtazapine (REMERON) 15 MG tablet, Take 1 tablet (15  mg total) by mouth at bedtime., Disp: 180 tablet, Rfl: 0   Multiple Vitamins-Minerals (MULTIVITAMIN ADULT PO), Take 1 tablet by mouth daily., Disp: , Rfl:    nitrofurantoin, macrocrystal-monohydrate, (MACROBID) 100 MG capsule, Take 1 capsule (100 mg total) by mouth 2 (two) times daily for 5 days., Disp: 10 capsule, Rfl: 0   ALPRAZolam (XANAX) 0.5 MG tablet, TAKE 1 TO 2 TABLETS BY MOUTH TWICE DAILY AS NEEDED FOR ANXIETY (PANIC ATTACKS) (Patient not taking: Reported on 03/04/2021), Disp: 30 tablet, Rfl: 3     ROS:  Review of Systems  Constitutional:  Negative for fatigue, fever and unexpected weight change.  Respiratory:  Negative for cough, shortness of breath and wheezing.   Cardiovascular:  Negative for chest pain, palpitations and leg swelling.   Gastrointestinal:  Negative for blood in stool, constipation, diarrhea, nausea and vomiting.  Endocrine: Negative for cold intolerance, heat intolerance and polyuria.  Genitourinary:  Positive for frequency and urgency. Negative for dyspareunia, dysuria, flank pain, genital sores, hematuria, menstrual problem, pelvic pain, vaginal bleeding, vaginal discharge and vaginal pain.  Musculoskeletal:  Negative for back pain, joint swelling and myalgias.  Skin:  Negative for rash.  Neurological:  Negative for dizziness, syncope, light-headedness, numbness and headaches.  Hematological:  Negative for adenopathy.  Psychiatric/Behavioral:  Negative for agitation, confusion, sleep disturbance and suicidal ideas. The patient is not nervous/anxious.   BREAST: No symptoms   Objective: BP 120/80   Ht 5\' 5"  (1.651 m)   Wt 115 lb (52.2 kg)   BMI 19.14 kg/m    Physical Exam Constitutional:      Appearance: She is well-developed.  Genitourinary:     Vulva normal.     Genitourinary Comments: UTERUS/CX SURG REM     Right Labia: No rash, tenderness or lesions.    Left Labia: No tenderness, lesions or rash.    Vaginal cuff intact.    No vaginal discharge, erythema or tenderness.      Right Adnexa: not tender and no mass present.    Left Adnexa: not tender and no mass present.    Cervix is absent.     Uterus is absent.  Breasts:    Right: No mass, nipple discharge, skin change or tenderness.     Left: No mass, nipple discharge, skin change or tenderness.  Neck:     Thyroid: No thyromegaly.  Cardiovascular:     Rate and Rhythm: Normal rate and regular rhythm.     Heart sounds: Normal heart sounds. No murmur heard. Pulmonary:     Effort: Pulmonary effort is normal.     Breath sounds: Normal breath sounds.  Abdominal:     Palpations: Abdomen is soft.     Tenderness: There is no abdominal tenderness. There is no guarding.  Musculoskeletal:        General: Normal range of motion.     Cervical  back: Normal range of motion.  Neurological:     General: No focal deficit present.     Mental Status: She is alert and oriented to person, place, and time.     Cranial Nerves: No cranial nerve deficit.  Skin:    General: Skin is warm and dry.  Psychiatric:        Mood and Affect: Mood normal.        Behavior: Behavior normal.        Thought Content: Thought content normal.        Judgment: Judgment normal.  Vitals reviewed.    Results:  Results for orders placed or performed in visit on 03/04/21 (from the past 24 hour(s))  POCT Urinalysis Dipstick     Status: Abnormal   Collection Time: 03/04/21  8:45 AM  Result Value Ref Range   Color, UA yellow    Clarity, UA clear    Glucose, UA Negative Negative   Bilirubin, UA neg    Ketones, UA neg    Spec Grav, UA 1.025 1.010 - 1.025   Blood, UA trace    pH, UA 6.5 5.0 - 8.0   Protein, UA Negative Negative   Urobilinogen, UA     Nitrite, UA neg    Leukocytes, UA Moderate (2+) (A) Negative   Appearance     Odor      Assessment/Plan: Encounter for annual routine gynecological examination  Cervical cancer screening - Plan: Cytology - PAP  Screening for HPV (human papillomavirus) - Plan: Cytology - PAP  Encounter for screening mammogram for malignant neoplasm of breast - Plan: MM 3D SCREEN BREAST BILATERAL; pt to sched mammo  Blood tests for routine general physical examination - Plan: Comprehensive metabolic panel, Lipid panel  Elevated lipids - Plan: Lipid panel; will f/u with results.   Screening cholesterol level - Plan: Lipid panel  Acute cystitis with hematuria - Plan: POCT Urinalysis Dipstick, nitrofurantoin, macrocrystal-monohydrate, (MACROBID) 100 MG capsule, Urine Culture; pos sx and UA, Rx macrobid, check C&S. F/u prn.   Meds ordered this encounter  Medications   nitrofurantoin, macrocrystal-monohydrate, (MACROBID) 100 MG capsule    Sig: Take 1 capsule (100 mg total) by mouth 2 (two) times daily for 5 days.     Dispense:  10 capsule    Refill:  0    Order Specific Question:   Supervising Provider    Answer:   Gae Dry [[id number]]              GYN counsel breast self exam, mammography screening, adequate intake of calcium and vitamin D, diet and exercise     F/U  Return in about 1 year (around 03/04/2022).  Gaylen Pereira B. Retina Bernardy, PA-C 03/04/2021 8:45 AM

## 2021-03-04 ENCOUNTER — Other Ambulatory Visit: Payer: Self-pay

## 2021-03-04 ENCOUNTER — Other Ambulatory Visit (HOSPITAL_COMMUNITY)
Admission: RE | Admit: 2021-03-04 | Discharge: 2021-03-04 | Disposition: A | Discharge status: Home / Self Care | Payer: No Typology Code available for payment source | Source: Ambulatory Visit | Attending: Obstetrics and Gynecology | Admitting: Obstetrics and Gynecology

## 2021-03-04 ENCOUNTER — Ambulatory Visit (INDEPENDENT_AMBULATORY_CARE_PROVIDER_SITE_OTHER): Payer: No Typology Code available for payment source | Admitting: Obstetrics and Gynecology

## 2021-03-04 ENCOUNTER — Encounter: Payer: Self-pay | Admitting: Obstetrics and Gynecology

## 2021-03-04 VITALS — BP 120/80 | Ht 65.0 in | Wt 115.0 lb

## 2021-03-04 DIAGNOSIS — N3001 Acute cystitis with hematuria: Secondary | ICD-10-CM | POA: Insufficient documentation

## 2021-03-04 DIAGNOSIS — Z124 Encounter for screening for malignant neoplasm of cervix: Secondary | ICD-10-CM | POA: Insufficient documentation

## 2021-03-04 DIAGNOSIS — Z1151 Encounter for screening for human papillomavirus (HPV): Secondary | ICD-10-CM | POA: Diagnosis present

## 2021-03-04 DIAGNOSIS — Z0000 Encounter for general adult medical examination without abnormal findings: Secondary | ICD-10-CM

## 2021-03-04 DIAGNOSIS — Z1231 Encounter for screening mammogram for malignant neoplasm of breast: Secondary | ICD-10-CM | POA: Diagnosis not present

## 2021-03-04 DIAGNOSIS — Z13220 Encounter for screening for lipoid disorders: Secondary | ICD-10-CM

## 2021-03-04 DIAGNOSIS — Z01419 Encounter for gynecological examination (general) (routine) without abnormal findings: Secondary | ICD-10-CM

## 2021-03-04 DIAGNOSIS — E785 Hyperlipidemia, unspecified: Secondary | ICD-10-CM

## 2021-03-04 LAB — POCT URINALYSIS DIPSTICK
Bilirubin, UA: NEGATIVE
Glucose, UA: NEGATIVE
Ketones, UA: NEGATIVE
Nitrite, UA: NEGATIVE
Protein, UA: NEGATIVE
Spec Grav, UA: 1.025 (ref 1.010–1.025)
pH, UA: 6.5 (ref 5.0–8.0)

## 2021-03-04 MED ORDER — NITROFURANTOIN MONOHYD MACRO 100 MG PO CAPS
100.0000 mg | ORAL_CAPSULE | Freq: Two times a day (BID) | ORAL | 0 refills | Status: AC
  Filled 2021-03-04: qty 10, 5d supply, fill #0

## 2021-03-04 NOTE — Patient Instructions (Addendum)
I value your feedback and you entrusting us with your care. If you get a [facility] patient survey, I would appreciate you taking the time to let us know about your experience today. Thank you!  [facility]: [phone]      

## 2021-03-05 LAB — COMPREHENSIVE METABOLIC PANEL
ALT: 25 IU/L (ref 0–32)
AST: 30 IU/L (ref 0–40)
Albumin/Globulin Ratio: 1.8 (ref 1.2–2.2)
Albumin: 4.7 g/dL (ref 3.8–4.8)
Alkaline Phosphatase: 102 IU/L (ref 44–121)
BUN/Creatinine Ratio: 9 (ref 9–23)
BUN: 7 mg/dL (ref 6–24)
Bilirubin Total: 0.6 mg/dL (ref 0.0–1.2)
CO2: 25 mmol/L (ref 20–29)
Calcium: 9.9 mg/dL (ref 8.7–10.2)
Chloride: 98 mmol/L (ref 96–106)
Creatinine, Ser: 0.8 mg/dL (ref 0.57–1.00)
Globulin, Total: 2.6 g/dL (ref 1.5–4.5)
Glucose: 86 mg/dL (ref 70–99)
Potassium: 4.1 mmol/L (ref 3.5–5.2)
Sodium: 139 mmol/L (ref 134–144)
Total Protein: 7.3 g/dL (ref 6.0–8.5)
eGFR: 95 mL/min/{1.73_m2} (ref >59)

## 2021-03-05 LAB — CYTOLOGY - PAP
Comment: NEGATIVE
Diagnosis: NEGATIVE
High risk HPV: NEGATIVE

## 2021-03-05 LAB — LIPID PANEL
Chol/HDL Ratio: 2.7 ratio (ref 0.0–4.4)
Cholesterol, Total: 240 mg/dL — ABNORMAL HIGH (ref 100–199)
HDL: 89 mg/dL (ref >39)
LDL Chol Calc (NIH): 137 mg/dL — ABNORMAL HIGH (ref 0–99)
Triglycerides: 85 mg/dL (ref 0–149)
VLDL Cholesterol Cal: 14 mg/dL (ref 5–40)

## 2021-03-07 LAB — URINE CULTURE

## 2021-04-29 ENCOUNTER — Other Ambulatory Visit: Payer: Self-pay

## 2021-05-08 ENCOUNTER — Ambulatory Visit
Admission: RE | Admit: 2021-05-08 | Discharge: 2021-05-08 | Disposition: A | Discharge status: Home / Self Care | Payer: No Typology Code available for payment source | Source: Ambulatory Visit | Attending: Obstetrics and Gynecology | Admitting: Obstetrics and Gynecology

## 2021-05-08 ENCOUNTER — Other Ambulatory Visit: Payer: Self-pay

## 2021-05-08 DIAGNOSIS — Z1231 Encounter for screening mammogram for malignant neoplasm of breast: Secondary | ICD-10-CM | POA: Insufficient documentation

## 2021-07-29 DIAGNOSIS — Z85820 Personal history of malignant melanoma of skin: Secondary | ICD-10-CM | POA: Diagnosis not present

## 2021-07-29 DIAGNOSIS — Z86018 Personal history of other benign neoplasm: Secondary | ICD-10-CM | POA: Diagnosis not present

## 2021-07-29 DIAGNOSIS — Z872 Personal history of diseases of the skin and subcutaneous tissue: Secondary | ICD-10-CM | POA: Diagnosis not present

## 2021-07-29 DIAGNOSIS — L578 Other skin changes due to chronic exposure to nonionizing radiation: Secondary | ICD-10-CM | POA: Diagnosis not present

## 2021-11-04 NOTE — Progress Notes (Signed)
I,Sha'taria Tyson,acting as a Education administrator for [employer], PA-C.,have documented all relevant documentation on the behalf of Mardene Speak, PA-C,as directed by  [employer], PA-C while in the presence of [employer], PA-C.    Established patient visit   Patient: Heidi Guerra   DOB: December 03, 1980   41 y.o. Female  MRN: [id number] Visit Date: 11/05/2021  Today's healthcare provider: Mardene Speak, PA-C  CC urinary symptoms  Subjective    Urinary symptoms  She reports new onset cloudy malodorous urine, urinary frequency, and incontinence, burning sensation while urinating and afterwards . The current episode started  4 days ago and is worsening. Patient states symptoms are 5/10 in intensity, occurring  mainly in the mornings . She  has not been recently treated for similar symptoms.    Associated symptoms: Yes abdominal pain No back pain  No chills No constipation  Yes cramping Yes diarrhea  No discharge No fever  No hematuria No nausea  No vomiting    ---------------------------------------------------------------------------------------   Medications: Outpatient Medications Prior to Visit  Medication Sig   mirtazapine (REMERON) 15 MG tablet Take 1 tablet (15 mg total) by mouth at bedtime.   Multiple Vitamins-Minerals (MULTIVITAMIN ADULT PO) Take 1 tablet by mouth daily.   No facility-administered medications prior to visit.    Review of Systems  All other systems reviewed and are negative.  Except see hpi     Objective    There were no vitals taken for this visit.   Physical Exam Vitals reviewed.  Constitutional:      General: She is not in acute distress.    Appearance: She is well-developed.  HENT:     Head: Normocephalic and atraumatic.  Eyes:     General: No scleral icterus.    Conjunctiva/sclera: Conjunctivae normal.  Cardiovascular:     Rate and Rhythm: Normal rate and regular rhythm.     Heart sounds: Normal heart sounds. No murmur  heard. Pulmonary:     Effort: Pulmonary effort is normal. No respiratory distress.     Breath sounds: Normal breath sounds. No wheezing or rales.  Abdominal:     General: There is no distension.     Palpations: Abdomen is soft.     Tenderness: There is abdominal tenderness in the suprapubic area. There is no guarding or rebound.  Skin:    General: Skin is warm and dry.     Capillary Refill: Capillary refill takes less than 2 seconds.     Findings: No rash.  Neurological:     Mental Status: She is alert and oriented to person, place, and time.  Psychiatric:        Behavior: Behavior normal.       No results found for any visits on 11/05/21.  Assessment & Plan     1. Dysuria  - POCT Urinalysis Dipstick positive for leukocytes - nitrofurantoin, macrocrystal-monohydrate, (MACROBID) 100 MG capsule; Take 1 capsule (100 mg total) by mouth 2 (two) times daily.  Dispense: 14 capsule; Refill: 0 Recommended to take for 5 days unless symptoms persist - 7 days  2. Urinary frequency  - POCT Urinalysis Dipstick - nitrofurantoin, macrocrystal-monohydrate, (MACROBID) 100 MG capsule; Take 1 capsule (100 mg total) by mouth 2 (two) times daily.  Dispense: 14 capsule; Refill: 0   FU as needed The patient was advised to call back or seek an in-person evaluation if the symptoms worsen or if the condition fails to improve as anticipated.  I discussed the  assessment and treatment plan with the patient. The patient was provided an opportunity to ask questions and all were answered. The patient agreed with the plan and demonstrated an understanding of the instructions.  The entirety of the information documented in the History of Present Illness, Review of Systems and Physical Exam were personally obtained by me. Portions of this information were initially documented by the CMA and reviewed by me for thoroughness and accuracy.  Portions of this note were created using dictation software and may  contain typographical errors.       Mardene Speak, PA-C  [facility] [phone] (phone) [phone] (fax)  [place]

## 2021-11-05 ENCOUNTER — Ambulatory Visit (INDEPENDENT_AMBULATORY_CARE_PROVIDER_SITE_OTHER): Payer: BC Managed Care – PPO | Admitting: Physician Assistant

## 2021-11-05 ENCOUNTER — Encounter: Payer: Self-pay | Admitting: Physician Assistant

## 2021-11-05 VITALS — BP 116/84 | HR 91 | Temp 97.7°F | Ht 65.0 in | Wt 111.0 lb

## 2021-11-05 DIAGNOSIS — R300 Dysuria: Secondary | ICD-10-CM | POA: Diagnosis not present

## 2021-11-05 DIAGNOSIS — R350 Frequency of micturition: Secondary | ICD-10-CM | POA: Diagnosis not present

## 2021-11-05 LAB — POCT URINALYSIS DIPSTICK
Bilirubin, UA: NEGATIVE
Glucose, UA: NEGATIVE
Ketones, UA: POSITIVE
Nitrite, UA: NEGATIVE
Protein, UA: POSITIVE — AB
Spec Grav, UA: <=1.005 — AB (ref 1.010–1.025)
Urobilinogen, UA: 0.2 E.U./dL
pH, UA: 6 (ref 5.0–8.0)

## 2021-11-05 MED ORDER — NITROFURANTOIN MONOHYD MACRO 100 MG PO CAPS: 100.0000 mg | ORAL_CAPSULE | Freq: Two times a day (BID) | ORAL | 0 refills | Status: DC

## 2021-11-07 ENCOUNTER — Encounter: Payer: Self-pay | Admitting: Physician Assistant

## 2022-04-07 ENCOUNTER — Ambulatory Visit: Payer: Self-pay | Admitting: Obstetrics and Gynecology

## 2022-04-14 NOTE — Progress Notes (Unsigned)
PCP:  Birdie Sons, MD   No chief complaint on file.    HPI:      [name] is a 42 y.o. [phone] whose LMP was No LMP recorded. Patient has had a hysterectomy., presents today for her annual examination.  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 No PMB, no pelvic pain.  Sex activity: single partner, contraception - status post hysterectomy. No pain/bleeding. Last Pap: 03/04/21 Results were: no abnormalities /neg HPV DNA  Hx of STDs: HPV with LEEP ~2007  Last mammogram: 05/08/21 Results were: normal--routine follow-up in 12 months There is no FH of breast cancer. There is no FH of ovarian cancer. The patient does do self-breast exams. Hx of LT breast bx with fibroadenoma 2017  Tobacco use: The patient denies current or previous tobacco use. Alcohol use: none No drug use.  Exercise: not active  She does get adequate calcium and Vitamin D in her diet.  Pt with urinary urgency/frequency, pelvic discomfort and tingle after voiding for 2 days without hematuria, LBP, fevers. Hx of UTI in distant past.   Hx of borderline lipids 11/21 and 11/22, but HDL is high. Normal DM and TSH screening 11/21  Patient Active Problem List   Diagnosis Date Noted   Acute cystitis with hematuria 03/04/2021   Situational stress 02/05/2021   Acute pain of right shoulder 02/05/2021   Pinched nerve in shoulder, right 02/05/2021   Insomnia 05/02/2015    Past Surgical History:  Procedure Laterality Date   ABDOMINAL HYSTERECTOMY  2008   for excessive bleeding   AUGMENTATION MAMMAPLASTY Bilateral 11/2017   BREAST BIOPSY Left 04/17/2015   FIBROADENOMA   CERVICAL BIOPSY  W/ LOOP ELECTRODE EXCISION     COLPOSCOPY     TONSILLECTOMY AND ADENOIDECTOMY  1999   TUBAL LIGATION  2007    Family History  Problem Relation Age of Onset   CAD Father    Heart attack Father        died age 22, waw smoker   Diabetes Maternal Grandmother    Diabetes Maternal  Grandfather    Scientist, research (medical) Parkinson White syndrome Daughter     Social History   Socioeconomic History   Marital status: Married    Spouse name: Not on file   Number of children: 2   Years of education: Not on file   Highest education level: Not on file  Occupational History   Not on file  Tobacco Use   Smoking status: Never   Smokeless tobacco: Never  Vaping Use   Vaping Use: Not on file  Substance and Sexual Activity   Alcohol use: Yes    Alcohol/week: 0.0 standard drinks of alcohol    Comment: occasional use   Drug use: No   Sexual activity: Yes    Birth control/protection: Surgical    Comment: Hysterectomy  Other Topics Concern   Not on file  Social History Narrative   Not on file   Social Determinants of Health   Financial Resource Strain: Not on file  Food Insecurity: Not on file  Transportation Needs: Not on file  Physical Activity: Not on file  Stress: Not on file  Social Connections: Not on file  Intimate Partner Violence: Not on file     Current Outpatient Medications:    Multiple Vitamins-Minerals (MULTIVITAMIN ADULT PO), Take 1 tablet by mouth daily., Disp: , Rfl:    nitrofurantoin, macrocrystal-monohydrate, (MACROBID) 100 MG capsule, Take 1 capsule (  100 mg total) by mouth 2 (two) times daily., Disp: 14 capsule, Rfl: 0     ROS:  Review of Systems  Constitutional:  Negative for fatigue, fever and unexpected weight change.  Respiratory:  Negative for cough, shortness of breath and wheezing.   Cardiovascular:  Negative for chest pain, palpitations and leg swelling.  Gastrointestinal:  Negative for blood in stool, constipation, diarrhea, nausea and vomiting.  Endocrine: Negative for cold intolerance, heat intolerance and polyuria.  Genitourinary:  Positive for frequency and urgency. Negative for dyspareunia, dysuria, flank pain, genital sores, hematuria, menstrual problem, pelvic pain, vaginal bleeding, vaginal discharge and vaginal pain.   Musculoskeletal:  Negative for back pain, joint swelling and myalgias.  Skin:  Negative for rash.  Neurological:  Negative for dizziness, syncope, light-headedness, numbness and headaches.  Hematological:  Negative for adenopathy.  Psychiatric/Behavioral:  Negative for agitation, confusion, sleep disturbance and suicidal ideas. The patient is not nervous/anxious.    BREAST: No symptoms   Objective: There were no vitals taken for this visit.   Physical Exam Constitutional:      Appearance: She is well-developed.  Genitourinary:     Vulva normal.     Genitourinary Comments: UTERUS/CX SURG REM     Right Labia: No rash, tenderness or lesions.    Left Labia: No tenderness, lesions or rash.    Vaginal cuff intact.    No vaginal discharge, erythema or tenderness.      Right Adnexa: not tender and no mass present.    Left Adnexa: not tender and no mass present.    Cervix is absent.     Uterus is absent.  Breasts:    Right: No mass, nipple discharge, skin change or tenderness.     Left: No mass, nipple discharge, skin change or tenderness.  Neck:     Thyroid: No thyromegaly.  Cardiovascular:     Rate and Rhythm: Normal rate and regular rhythm.     Heart sounds: Normal heart sounds. No murmur heard. Pulmonary:     Effort: Pulmonary effort is normal.     Breath sounds: Normal breath sounds.  Abdominal:     Palpations: Abdomen is soft.     Tenderness: There is no abdominal tenderness. There is no guarding.  Musculoskeletal:        General: Normal range of motion.     Cervical back: Normal range of motion.  Neurological:     General: No focal deficit present.     Mental Status: She is alert and oriented to person, place, and time.     Cranial Nerves: No cranial nerve deficit.  Skin:    General: Skin is warm and dry.  Psychiatric:        Mood and Affect: Mood normal.        Behavior: Behavior normal.        Thought Content: Thought content normal.        Judgment: Judgment  normal.  Vitals reviewed.     Results: No results found for this or any previous visit (from the past 24 hour(s)).   Assessment/Plan: Encounter for annual routine gynecological examination  Cervical cancer screening - Plan: Cytology - PAP  Screening for HPV (human papillomavirus) - Plan: Cytology - PAP  Encounter for screening mammogram for malignant neoplasm of breast - Plan: MM 3D SCREEN BREAST BILATERAL; pt to sched mammo  Blood tests for routine general physical examination - Plan: Comprehensive metabolic panel, Lipid panel  Elevated lipids - Plan: Lipid panel; will  f/u with results.   Screening cholesterol level - Plan: Lipid panel  Acute cystitis with hematuria - Plan: POCT Urinalysis Dipstick, nitrofurantoin, macrocrystal-monohydrate, (MACROBID) 100 MG capsule, Urine Culture; pos sx and UA, Rx macrobid, check C&S. F/u prn.   No orders of the defined types were placed in this encounter.             GYN counsel breast self exam, mammography screening, adequate intake of calcium and vitamin D, diet and exercise     F/U  No follow-ups on file.  Clodagh Odenthal B. Ceejay Kegley, PA-C 04/14/2022 1:32 PM

## 2022-04-16 ENCOUNTER — Encounter: Payer: Self-pay | Admitting: Obstetrics and Gynecology

## 2022-04-16 ENCOUNTER — Ambulatory Visit (INDEPENDENT_AMBULATORY_CARE_PROVIDER_SITE_OTHER): Payer: No Typology Code available for payment source | Admitting: Obstetrics and Gynecology

## 2022-04-16 VITALS — BP 110/80 | Ht 65.0 in | Wt 111.0 lb

## 2022-04-16 DIAGNOSIS — Z23 Encounter for immunization: Secondary | ICD-10-CM

## 2022-04-16 DIAGNOSIS — Z01419 Encounter for gynecological examination (general) (routine) without abnormal findings: Secondary | ICD-10-CM

## 2022-04-16 DIAGNOSIS — B029 Zoster without complications: Secondary | ICD-10-CM | POA: Diagnosis not present

## 2022-04-16 DIAGNOSIS — Z1231 Encounter for screening mammogram for malignant neoplasm of breast: Secondary | ICD-10-CM

## 2022-04-16 MED ORDER — VALACYCLOVIR HCL 1 G PO TABS: 1000.0000 mg | ORAL_TABLET | Freq: Three times a day (TID) | ORAL | 0 refills | Status: AC

## 2022-04-16 NOTE — Addendum Note (Signed)
Addended by: Ardeth Perfect B on: 06/15/8116 09:43 AM   Modules accepted: Orders

## 2022-04-16 NOTE — Patient Instructions (Addendum)
I value your feedback and you entrusting us with your care. If you get a [facility] patient survey, I would appreciate you taking the time to let us know about your experience today. Thank you!  [facility]: [phone]      

## 2022-05-20 ENCOUNTER — Ambulatory Visit
Admission: RE | Admit: 2022-05-20 | Discharge: 2022-05-20 | Disposition: A | Discharge status: Home / Self Care | Payer: No Typology Code available for payment source | Source: Ambulatory Visit | Attending: Obstetrics and Gynecology | Admitting: Obstetrics and Gynecology

## 2022-05-20 DIAGNOSIS — Z1231 Encounter for screening mammogram for malignant neoplasm of breast: Secondary | ICD-10-CM | POA: Diagnosis present

## 2023-04-19 NOTE — Progress Notes (Signed)
 PCP:  Gasper Nancyann BRAVO, MD   Chief Complaint  Patient presents with   Gynecologic Exam    No concerns     HPI:      [name] is a 43 y.o. [phone] whose LMP was No LMP recorded. Patient has had a hysterectomy., presents today for her annual examination.  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 No PMB, no pelvic pain. Having significant vasomotor sx now, improved with menopause supp.   Sex activity: single partner, contraception - status post hysterectomy. No pain/bleeding/dryness. Last Pap: 03/04/21 Results were: no abnormalities /neg HPV DNA  Hx of STDs: HPV with LEEP ~2007  Last mammogram: 05/20/22 Results were: normal--routine follow-up in 12 months There is no FH of breast cancer. There is no FH of ovarian cancer. The patient does do self-breast exams. Hx of LT breast bx with fibroadenoma 2017  Tobacco use: The patient denies current or previous tobacco use. Alcohol use: none No drug use.  Exercise: occas active  She does get adequate calcium and Vitamin D in her diet.  Normal labs through work 2024. Total=250, TG=61, HDL-125, LDL=115. All other labs WNL. Hx of borderline lipids 11/21 and 11/22, but HDL is high.   Pt would like Rx RF valtrex . Had shingles twice RT flank/low back area after covid twice. Occas feels twinges; nervous about recurrence. No hx of HSV 2.  Patient Active Problem List   Diagnosis Date Noted   Acute cystitis with hematuria 03/04/2021   Situational stress 02/05/2021   Acute pain of right shoulder 02/05/2021   Pinched nerve in shoulder, right 02/05/2021   Insomnia 05/02/2015    Past Surgical History:  Procedure Laterality Date   ABDOMINAL HYSTERECTOMY  2008   for excessive bleeding   AUGMENTATION MAMMAPLASTY Bilateral 11/2017   BREAST BIOPSY Left 04/17/2015   FIBROADENOMA   CERVICAL BIOPSY  W/ LOOP ELECTRODE EXCISION     COLPOSCOPY     TONSILLECTOMY AND ADENOIDECTOMY  1999   TUBAL LIGATION  2007     Family History  Problem Relation Age of Onset   CAD Father    Heart attack Father        died age 49, waw smoker   Diabetes Maternal Grandmother    Diabetes Maternal Grandfather    Insurance Account Manager Parkinson White syndrome Daughter     Social History   Socioeconomic History   Marital status: Married    Spouse name: Not on file   Number of children: 2   Years of education: Not on file   Highest education level: Not on file  Occupational History   Not on file  Tobacco Use   Smoking status: Never   Smokeless tobacco: Never  Vaping Use   Vaping status: Not on file  Substance and Sexual Activity   Alcohol use: Yes    Alcohol/week: 0.0 standard drinks of alcohol    Comment: occasional use   Drug use: No   Sexual activity: Yes    Birth control/protection: Surgical    Comment: Hysterectomy  Other Topics Concern   Not on file  Social History Narrative   Not on file   Social Drivers of Health   Financial Resource Strain: Not on file  Food Insecurity: Not on file  Transportation Needs: Not on file  Physical Activity: Not on file  Stress: Not on file  Social Connections: Not on file  Intimate Partner Violence: Not on file  Current Outpatient Medications:    Multiple Vitamins-Minerals (MULTIVITAMIN ADULT PO), Take 1 tablet by mouth daily., Disp: , Rfl:      ROS:  Review of Systems  Constitutional:  Negative for fatigue, fever and unexpected weight change.  Respiratory:  Negative for cough, shortness of breath and wheezing.   Cardiovascular:  Negative for chest pain, palpitations and leg swelling.  Gastrointestinal:  Negative for blood in stool, constipation, diarrhea, nausea and vomiting.  Endocrine: Negative for cold intolerance, heat intolerance and polyuria.  Genitourinary:  Negative for dyspareunia, dysuria, flank pain, frequency, genital sores, hematuria, menstrual problem, pelvic pain, urgency, vaginal bleeding, vaginal discharge and vaginal pain.   Musculoskeletal:  Negative for back pain, joint swelling and myalgias.  Skin:  Negative for rash.  Neurological:  Negative for dizziness, syncope, light-headedness, numbness and headaches.  Hematological:  Negative for adenopathy.  Psychiatric/Behavioral:  Negative for agitation, confusion, sleep disturbance and suicidal ideas. The patient is not nervous/anxious.    BREAST: No symptoms   Objective: BP 125/80   Pulse (!) 102   Ht 5' 5 (1.651 m)   Wt 111 lb (50.3 kg)   BMI 18.47 kg/m    Physical Exam Constitutional:      Appearance: She is well-developed.  Genitourinary:     Vulva normal.     Genitourinary Comments: UTERUS/CX SURG REM     Right Labia: No rash, tenderness or lesions.    Left Labia: No tenderness, lesions or rash.    Vaginal cuff intact.    No vaginal discharge, erythema or tenderness.      Right Adnexa: not tender and no mass present.    Left Adnexa: not tender and no mass present.    Cervix is absent.     Uterus is absent.  Breasts:    Right: No mass, nipple discharge, skin change or tenderness.     Left: No mass, nipple discharge, skin change or tenderness.  Neck:     Thyroid : No thyromegaly.  Cardiovascular:     Rate and Rhythm: Normal rate and regular rhythm.     Heart sounds: Normal heart sounds. No murmur heard. Pulmonary:     Effort: Pulmonary effort is normal.     Breath sounds: Normal breath sounds.  Abdominal:     Palpations: Abdomen is soft.     Tenderness: There is no abdominal tenderness. There is no guarding.  Musculoskeletal:        General: Normal range of motion.     Cervical back: Normal range of motion.  Neurological:     General: No focal deficit present.     Mental Status: She is alert and oriented to person, place, and time.     Cranial Nerves: No cranial nerve deficit.  Skin:    General: Skin is warm and dry.  Psychiatric:        Mood and Affect: Mood normal.        Behavior: Behavior normal.        Thought Content:  Thought content normal.        Judgment: Judgment normal.  Vitals reviewed.     Assessment/Plan: Encounter for annual routine gynecological examination  Encounter for screening mammogram for malignant neoplasm of breast - Plan: MM 3D SCREENING MAMMOGRAM BILATERAL BREAST; pt to schedule mammo  Vasomotor symptoms--improved with menopause support supp.           GYN counsel breast self exam, mammography screening, adequate intake of calcium and vitamin D, diet and exercise  F/U  Return in about 1 year (around 04/19/2024).  Birgitta Uhlir B. Keegan Bensch, PA-C 04/20/2023 1:55 PM

## 2023-04-20 ENCOUNTER — Encounter: Payer: Self-pay | Admitting: Obstetrics and Gynecology

## 2023-04-20 ENCOUNTER — Ambulatory Visit (INDEPENDENT_AMBULATORY_CARE_PROVIDER_SITE_OTHER): Payer: No Typology Code available for payment source | Admitting: Obstetrics and Gynecology

## 2023-04-20 VITALS — BP 125/80 | HR 102 | Ht 65.0 in | Wt 111.0 lb

## 2023-04-20 DIAGNOSIS — Z01419 Encounter for gynecological examination (general) (routine) without abnormal findings: Secondary | ICD-10-CM | POA: Diagnosis not present

## 2023-04-20 DIAGNOSIS — Z0000 Encounter for general adult medical examination without abnormal findings: Secondary | ICD-10-CM

## 2023-04-20 DIAGNOSIS — N951 Menopausal and female climacteric states: Secondary | ICD-10-CM

## 2023-04-20 DIAGNOSIS — Z1231 Encounter for screening mammogram for malignant neoplasm of breast: Secondary | ICD-10-CM

## 2023-04-20 DIAGNOSIS — Z13220 Encounter for screening for lipoid disorders: Secondary | ICD-10-CM

## 2023-04-20 NOTE — Patient Instructions (Addendum)
 I value your feedback and you entrusting [place] with your care. If you get a [facility] patient survey, I would appreciate you taking the time to let us know about your experience today. Thank you!  [facility] ([place])--[phone]

## 2023-06-03 ENCOUNTER — Ambulatory Visit
Admission: RE | Admit: 2023-06-03 | Discharge: 2023-06-03 | Disposition: A | Discharge status: Home / Self Care | Payer: No Typology Code available for payment source | Source: Ambulatory Visit | Attending: Obstetrics and Gynecology | Admitting: Obstetrics and Gynecology

## 2023-06-03 DIAGNOSIS — Z1231 Encounter for screening mammogram for malignant neoplasm of breast: Secondary | ICD-10-CM | POA: Diagnosis present

## 2023-06-07 ENCOUNTER — Encounter: Payer: Self-pay | Admitting: Obstetrics and Gynecology

## 2024-04-21 NOTE — Progress Notes (Signed)
 "  ANNUAL GYNECOLOGICAL EXAM  SUBJECTIVE  HPI  Heidi Guerra is a 44 y.o.-year-old G2P2002 who presents for an annual gynecological exam today.  She denies pelvic pain, dyspareunia, abnormal vaginal bleeding or discharge, and UTI symptoms. She is s/p hysterectomy. She has been struggling with hot flashes, nights sweats, insomnia, and irritability as well as slower sexual response. She is sexually active with a female partner.   Medical/Surgical History Past Medical History:  Diagnosis Date   Anxiety    History of chicken pox    Past Surgical History:  Procedure Laterality Date   ABDOMINAL HYSTERECTOMY  2008   for excessive bleeding   AUGMENTATION MAMMAPLASTY Bilateral 11/2017   BREAST BIOPSY Left 04/17/2015   FIBROADENOMA   CERVICAL BIOPSY  W/ LOOP ELECTRODE EXCISION     COLPOSCOPY     TONSILLECTOMY AND ADENOIDECTOMY  1999   TUBAL LIGATION  2007    Social History Lives with husband  and son . Feels safe there Work: [employer]  Exercise: yoga, stretching, weights Substances: no daily EtOH, no  tobacco, vape, and recreational drugs  Obstetric History OB History     Gravida  2   Para  2   Term  2   Preterm      AB      Living  2      SAB      IAB      Ectopic      Multiple      Live Births  2            GYN/Menstrual History No LMP recorded. Patient has had a hysterectomy. Last Pap:03/04/21 Contraception: hysterectomy   Prevention Dentist utd  Eye exam utd  Mammogram due next month  Colonoscopy: due this year Flu shot/vaccines today   Current Medications Show/hide medication list[1]      ROS Constitutional: Denied  recent illness, fatigue, fever, and weight loss.  Eyes: Denied eye symptoms, eye pain, photophobia, vision change and visual disturbance.  Ears/Nose/Throat/Neck: Denied ear, nose, throat or neck symptoms, hearing loss, nasal discharge, sinus congestion and sore throat.  Cardiovascular: Denied cardiovascular symptoms,  arrhythmia, chest pain/pressure, edema, exercise intolerance, orthopnea and palpitations.  Respiratory: Denied pulmonary symptoms, asthma, pleuritic pain, productive sputum, cough, dyspnea and wheezing.  Gastrointestinal: Denied gastro-esophageal reflux, melena, nausea and vomiting.  Genitourinary: Denied genitourinary symptoms including symptomatic vaginal discharge, pelvic relaxation issues, and urinary complaints.  Musculoskeletal: Denied musculoskeletal symptoms, stiffness, swelling, muscle weakness and myalgia.  Dermatologic: Denied dermatology symptoms, rash and scar.  Neurologic: Denied neurology symptoms, dizziness, headache, neck pain and syncope.  Psychiatric: Denied psychiatric symptoms, anxiety and depression.  Endocrine: Hot flashes, night sweats, insomnia    OBJECTIVE  BP 126/83   Pulse 88   Ht 5' 5 (1.651 m)   Wt 108 lb (49 kg)   BMI 17.97 kg/m    Physical examination General NAD, Conversant  HEENT Atraumatic; Op clear with mmm.  Normo-cephalic. Pupils reactive. Anicteric sclerae  Thyroid /Neck Smooth without nodularity or enlargement. Normal ROM.  Neck Supple.  Skin No rashes, lesions or ulceration. Normal palpated skin turgor. No nodularity.  Breasts: Declined  Lungs: Clear to auscultation.No rales or wheezes. Normal Respiratory effort, no retractions.  Heart: NSR.  No murmurs or rubs appreciated. No peripheral edema  Abdomen: Soft.  Non-tender.  No masses.  No HSM. No hernia  Extremities: Moves all appropriately.  Normal ROM for age. No lymphadenopathy.  Neuro: Oriented to PPT.  Normal mood. Normal affect.  Pelvic: Declined    ASSESSMENT  1) Annual exam 2) Vasomotor symptoms of perimenopause  PLAN 1) Physical exam as noted. Discussed healthy lifestyle choices and preventive care. Declines STI testing. Routine labs done at work. 2) Discussed risks and benefits of hormone therapy, as well as modes of administration and expected time to symptom relief.  Fleur would like to try to the patch. Rx sent for Vivelle  Dot 0.05 mg. Will f/u in 3 months.  Return in one year for annual exam or as needed for concerns.   Eleanor Canny, CNM      [1]  Outpatient Medications Prior to Visit  Medication Sig   Multiple Vitamins-Minerals (MULTIVITAMIN ADULT PO) Take 1 tablet by mouth daily.   No facility-administered medications prior to visit.   "

## 2024-04-24 ENCOUNTER — Encounter: Payer: Self-pay | Admitting: Obstetrics

## 2024-04-24 ENCOUNTER — Ambulatory Visit: Admitting: Obstetrics

## 2024-04-24 VITALS — BP 126/83 | HR 88 | Ht 65.0 in | Wt 108.0 lb

## 2024-04-24 DIAGNOSIS — Z23 Encounter for immunization: Secondary | ICD-10-CM

## 2024-04-24 DIAGNOSIS — Z01419 Encounter for gynecological examination (general) (routine) without abnormal findings: Secondary | ICD-10-CM | POA: Diagnosis not present

## 2024-04-24 DIAGNOSIS — Z1211 Encounter for screening for malignant neoplasm of colon: Secondary | ICD-10-CM

## 2024-04-24 DIAGNOSIS — N951 Menopausal and female climacteric states: Secondary | ICD-10-CM

## 2024-04-24 DIAGNOSIS — Z1231 Encounter for screening mammogram for malignant neoplasm of breast: Secondary | ICD-10-CM

## 2024-04-24 DIAGNOSIS — Z79890 Hormone replacement therapy (postmenopausal): Secondary | ICD-10-CM

## 2024-04-24 MED ORDER — ESTRADIOL 0.05 MG/24HR TD PTTW: 1.0000 | MEDICATED_PATCH | TRANSDERMAL | 12 refills | Status: DC

## 2024-04-25 ENCOUNTER — Other Ambulatory Visit: Payer: Self-pay | Admitting: Obstetrics

## 2024-04-26 ENCOUNTER — Other Ambulatory Visit: Payer: Self-pay

## 2024-04-26 ENCOUNTER — Telehealth: Payer: Self-pay

## 2024-04-26 DIAGNOSIS — Z1211 Encounter for screening for malignant neoplasm of colon: Secondary | ICD-10-CM

## 2024-04-26 NOTE — Telephone Encounter (Signed)
 Gastroenterology Pre-Procedure Review  Request Date: 05/15/24 Requesting Physician: Dr. THEOPHILUS  PATIENT REVIEW QUESTIONS: The patient responded to the following health history questions as indicated:    1. Are you having any GI issues? no 2. Do you have a personal history of Polyps? no 3. Do you have a family history of Colon Cancer or Polyps? no 4. Diabetes Mellitus? no 5. Joint replacements in the past 12 months?no 6. Major health problems in the past 3 months?no 7. Any artificial heart valves, MVP, or defibrillator?no    MEDICATIONS & ALLERGIES:    Patient reports the following regarding taking any anticoagulation/antiplatelet therapy:   Plavix, Coumadin, Eliquis, Xarelto, Lovenox, Pradaxa, Brilinta, or Effient? no Aspirin? no  Patient confirms/reports the following medications:  Current Outpatient Medications  Medication Sig Dispense Refill   estradiol  (CLIMARA  - DOSED IN MG/24 HR) 0.05 mg/24hr patch Place 1 patch (0.05 mg total) onto the skin once a week. 4 patch 12   Multiple Vitamins-Minerals (MULTIVITAMIN ADULT PO) Take 1 tablet by mouth daily.     No current facility-administered medications for this visit.    Patient confirms/reports the following allergies:  Allergies[1]  No orders of the defined types were placed in this encounter.   AUTHORIZATION INFORMATION Primary Insurance: 1D#: Group #:  Secondary Insurance: 1D#: Group #:  SCHEDULE INFORMATION: Date: 05/15/24 Time: Location: [facility]    [1]  Allergies Allergen Reactions   Singulair  [Montelukast Sodium]     Rash   Tape Rash

## 2024-05-09 ENCOUNTER — Telehealth: Payer: Self-pay

## 2024-05-09 MED ORDER — NA SULFATE-K SULFATE-MG SULF 17.5-3.13-1.6 GM/177ML PO SOLN: 1.0000 | Freq: Once | ORAL | 0 refills | Status: AC

## 2024-05-09 NOTE — Telephone Encounter (Signed)
 Patient left message with Endo stating she could not locate instructions in her mychart.   Call returned.  Helped her to find her instructions in her mychart.  Prep sent to her pharmacy [facility] on [address]  Maeola Browning, CMA

## 2024-05-15 ENCOUNTER — Encounter: Admission: RE | Payer: Self-pay | Source: Home / Self Care

## 2024-05-15 ENCOUNTER — Ambulatory Visit: Admission: RE | Admit: 2024-05-15

## 2024-06-06 ENCOUNTER — Encounter
